# Patient Record
Sex: Female | Born: 1990 | Race: Black or African American | Hispanic: No | Marital: Single | Smoking: Never smoker
Health system: Southern US, Community
[De-identification: ages and names within clinical notes are randomized; demographics above are authoritative.]

## PROBLEM LIST (undated history)

## (undated) DIAGNOSIS — G43909 Migraine, unspecified, not intractable, without status migrainosus: Secondary | ICD-10-CM

## (undated) DIAGNOSIS — Z975 Presence of (intrauterine) contraceptive device: Secondary | ICD-10-CM

## (undated) HISTORY — PX: WISDOM TOOTH EXTRACTION: SHX21

## (undated) HISTORY — PX: OTHER SURGICAL HISTORY: SHX169

## (undated) HISTORY — DX: Presence of (intrauterine) contraceptive device: Z97.5

## (undated) HISTORY — DX: Migraine, unspecified, not intractable, without status migrainosus: G43.909

---
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
## 2012-08-03 ENCOUNTER — Encounter (HOSPITAL_COMMUNITY): Payer: Self-pay | Admitting: *Deleted

## 2012-08-03 ENCOUNTER — Emergency Department (INDEPENDENT_AMBULATORY_CARE_PROVIDER_SITE_OTHER)
Admission: EM | Admit: 2012-08-03 | Discharge: 2012-08-03 | Disposition: A | Discharge status: Home / Self Care | Payer: Medicaid Other | Source: Home / Self Care | Attending: Family Medicine | Admitting: Family Medicine

## 2012-08-03 DIAGNOSIS — N390 Urinary tract infection, site not specified: Secondary | ICD-10-CM

## 2012-08-03 LAB — POCT URINALYSIS DIP (DEVICE)
Nitrite: NEGATIVE
Protein, ur: 100 mg/dL — AB
pH: 6 (ref 5.0–8.0)

## 2012-08-03 MED ORDER — CEPHALEXIN 500 MG PO CAPS: 500.0000 mg | ORAL_CAPSULE | Freq: Four times a day (QID) | ORAL | Status: DC

## 2012-08-03 NOTE — ED Notes (Signed)
Py  Reports  Symptoms     Of  Back  Pain   As   Well  As   Frequency  Of  Urination    Symptoms  X  1  Week  She  Reports  She  Has  Been  utilising  Cranberry  Juice  To  Aid  i  The symptoms      She  Ambulated  With a  Steady fluid  Gait       She is  Sitting upright on  Exam table  In no acute  Distress

## 2012-08-03 NOTE — ED Provider Notes (Signed)
History     CSN: [id number]  Arrival date & time 08/03/12  1140   First MD Initiated Contact with Patient 08/03/12 1328      Chief Complaint  Patient presents with  . Urinary Tract Infection    (Consider location/radiation/quality/duration/timing/severity/associated sxs/prior treatment) Patient is a 21 y.o. female presenting with urinary tract infection. The history is provided by the patient.  Urinary Tract Infection This is a new problem. The current episode started more than 1 week ago. The problem has been gradually worsening. Pertinent negatives include no abdominal pain.    History reviewed. No pertinent past medical history.  History reviewed. No pertinent past surgical history.  No family history on file.  History  Substance Use Topics  . Smoking status: Never Smoker   . Smokeless tobacco: Not on file  . Alcohol Use: No    OB History    Grav Para Term Preterm Abortions TAB SAB Ect Mult Living                  Review of Systems  Constitutional: Negative.  Negative for fever and chills.  Gastrointestinal: Negative.  Negative for abdominal pain.  Genitourinary: Positive for dysuria, urgency and frequency. Negative for vaginal bleeding, vaginal discharge and vaginal pain.    Allergies  Review of patient's allergies indicates no known allergies.  Home Medications   Current Outpatient Rx  Name Route Sig Dispense Refill  . CEPHALEXIN 500 MG PO CAPS Oral Take 1 capsule (500 mg total) by mouth 4 (four) times daily. Take all of medicine and drink lots of fluids 20 capsule 0    BP 107/69  Pulse 62  Temp 98.7 F (37.1 C) (Oral)  Resp 20  SpO2 95%  Physical Exam  Nursing note and vitals reviewed. Constitutional: She is oriented to person, place, and time. She appears well-developed and well-nourished.  Neck: Normal range of motion. Neck supple.  Abdominal: Soft. Bowel sounds are normal. She exhibits no distension and no mass. There is no tenderness. There  is no rebound and no guarding.  Neurological: She is alert and oriented to person, place, and time.  Skin: Skin is warm and dry.    ED Course  Procedures (including critical care time)  Labs Reviewed  POCT URINALYSIS DIP (DEVICE) - Abnormal; Notable for the following:    Hgb urine dipstick LARGE (*)     Protein, ur 100 (*)     Leukocytes, UA MODERATE (*)  Biochemical Testing Only. Please order routine urinalysis from main lab if confirmatory testing is needed.   All other components within normal limits  POCT PREGNANCY, URINE   No results found.   1. UTI (lower urinary tract infection)       MDM  U/a abnl.        Linna Hoff, MD 08/03/12 [phone]

## 2013-11-02 ENCOUNTER — Encounter (HOSPITAL_COMMUNITY): Payer: Self-pay | Admitting: Emergency Medicine

## 2013-11-02 ENCOUNTER — Emergency Department (INDEPENDENT_AMBULATORY_CARE_PROVIDER_SITE_OTHER)
Admission: EM | Admit: 2013-11-02 | Discharge: 2013-11-02 | Disposition: A | Discharge status: Home / Self Care | Payer: Self-pay | Source: Home / Self Care | Attending: Family Medicine | Admitting: Family Medicine

## 2013-11-02 DIAGNOSIS — A088 Other specified intestinal infections: Secondary | ICD-10-CM

## 2013-11-02 DIAGNOSIS — A084 Viral intestinal infection, unspecified: Secondary | ICD-10-CM

## 2013-11-02 LAB — POCT URINALYSIS DIP (DEVICE)
Bilirubin Urine: NEGATIVE
GLUCOSE, UA: NEGATIVE mg/dL
KETONES UR: NEGATIVE mg/dL
Leukocytes, UA: NEGATIVE
Nitrite: NEGATIVE
Protein, ur: 30 mg/dL — AB
UROBILINOGEN UA: 0.2 mg/dL (ref 0.0–1.0)
pH: 5.5 (ref 5.0–8.0)

## 2013-11-02 LAB — POCT PREGNANCY, URINE: PREG TEST UR: NEGATIVE

## 2013-11-02 MED ORDER — ONDANSETRON 4 MG PO TBDP
8.0000 mg | ORAL_TABLET | Freq: Once | ORAL | Status: AC
  Administered 2013-11-02: 8 mg via ORAL

## 2013-11-02 MED ORDER — PROMETHAZINE HCL 25 MG PO TABS: 25.0000 mg | ORAL_TABLET | Freq: Four times a day (QID) | ORAL | Status: DC | PRN

## 2013-11-02 MED ORDER — ONDANSETRON 4 MG PO TBDP
ORAL_TABLET | ORAL | Status: AC
  Filled 2013-11-02: qty 2

## 2013-11-02 NOTE — ED Notes (Addendum)
Pt c/o vomiting and having diarrhea onset today... Has had 3 episodes of vomiting and 1 loose stool Denies: fevers, cold sxs, abd pain, urinary sxs ... LMP on 10/29/13 She is alert w/no signs of acute distress.

## 2013-11-02 NOTE — ED Provider Notes (Signed)
Kara Velez is a 23 y.o. female who presents to Urgent Care today for 12 hours of nausea vomiting and diarrhea. No fevers chills abdominal pain chest pain or trouble breathing. No medications tried. She is not giving much fluids down but is still urinating. She feels well otherwise. No other people with similar symptoms at home.   History reviewed. No pertinent past medical history. History  Substance Use Topics  . Smoking status: Never Smoker   . Smokeless tobacco: Not on file  . Alcohol Use: No   ROS as above Medications: Current Facility-Administered Medications  Medication Dose Route Frequency Provider Last Rate Last Dose  . ondansetron (ZOFRAN-ODT) disintegrating tablet 8 mg  8 mg Oral Once Rodolph BongEvan S Corey, MD       Current Outpatient Prescriptions  Medication Sig Dispense Refill  . promethazine (PHENERGAN) 25 MG tablet Take 1 tablet (25 mg total) by mouth every 6 (six) hours as needed for nausea or vomiting.  30 tablet  0    Exam:  BP 111/70  Pulse 96  Resp 18  SpO2 100%  LMP 10/29/2013 Gen: Well NAD HEENT: EOMI,  MMM Lungs: Normal work of breathing. CTABL Heart: RRR no MRG Abd: NABS, Soft. NT, ND Exts: Brisk capillary refill, warm and well perfused.   Results for orders placed during the hospital encounter of 11/02/13 (from the past 24 hour(s))  POCT URINALYSIS DIP (DEVICE)     Status: Abnormal   Collection Time    11/02/13  9:59 AM      Result Value Range   Glucose, UA NEGATIVE  NEGATIVE mg/dL   Bilirubin Urine NEGATIVE  NEGATIVE   Ketones, ur NEGATIVE  NEGATIVE mg/dL   Specific Gravity, Urine >=1.030  1.005 - 1.030   Hgb urine dipstick LARGE (*) NEGATIVE   pH 5.5  5.0 - 8.0   Protein, ur 30 (*) NEGATIVE mg/dL   Urobilinogen, UA 0.2  0.0 - 1.0 mg/dL   Nitrite NEGATIVE  NEGATIVE   Leukocytes, UA NEGATIVE  NEGATIVE  POCT PREGNANCY, URINE     Status: None   Collection Time    11/02/13  9:59 AM      Result Value Range   Preg Test, Ur NEGATIVE  NEGATIVE   No  results found.  Assessment and Plan: 23 y.o. female with viral gastroenteritis.  Plan to provide Zofran in the clinic in a Phenergan prescription. Encourage oral hydration. Followup with primary care provider.  Discussed warning signs or symptoms. Please see discharge instructions. Patient expresses understanding.    Rodolph BongEvan S Corey, MD 11/02/13 1012

## 2013-11-02 NOTE — Discharge Instructions (Signed)
Thank you for coming in today. Take Phenergan as needed for vomiting. Drink small amounts of water.  If you get dramatically worse to the emergency room.  If your belly pain worsens, or you have high fever, bad vomiting, blood in your stool or black tarry stool go to the Emergency Room.

## 2014-09-11 ENCOUNTER — Encounter (HOSPITAL_COMMUNITY): Payer: Self-pay | Admitting: *Deleted

## 2014-09-11 ENCOUNTER — Emergency Department (HOSPITAL_COMMUNITY)
Admission: EM | Admit: 2014-09-11 | Discharge: 2014-09-12 | Disposition: A | Discharge status: Home / Self Care | Payer: Medicaid Other | Attending: Emergency Medicine | Admitting: Emergency Medicine

## 2014-09-11 ENCOUNTER — Emergency Department (HOSPITAL_COMMUNITY): Payer: Medicaid Other

## 2014-09-11 DIAGNOSIS — R079 Chest pain, unspecified: Secondary | ICD-10-CM | POA: Diagnosis not present

## 2014-09-11 DIAGNOSIS — R42 Dizziness and giddiness: Secondary | ICD-10-CM | POA: Insufficient documentation

## 2014-09-11 DIAGNOSIS — Z3202 Encounter for pregnancy test, result negative: Secondary | ICD-10-CM | POA: Insufficient documentation

## 2014-09-11 LAB — BASIC METABOLIC PANEL
ANION GAP: 17 — AB (ref 5–15)
BUN: 9 mg/dL (ref 6–23)
CALCIUM: 9.5 mg/dL (ref 8.4–10.5)
CO2: 19 meq/L (ref 19–32)
Chloride: 104 mEq/L (ref 96–112)
Creatinine, Ser: 0.6 mg/dL (ref 0.50–1.10)
GFR calc Af Amer: >90 mL/min (ref >90)
GFR calc non Af Amer: >90 mL/min (ref >90)
Glucose, Bld: 85 mg/dL (ref 70–99)
Potassium: 3.8 mEq/L (ref 3.7–5.3)
SODIUM: 140 meq/L (ref 137–147)

## 2014-09-11 LAB — I-STAT TROPONIN, ED: TROPONIN I, POC: 0.02 ng/mL (ref 0.00–0.08)

## 2014-09-11 NOTE — ED Provider Notes (Signed)
CSN: [id number]     Arrival date & time 09/11/14  1940 History  This chart was scribed for [name], MD by Murriel HopperAlec Bankhead, ED Scribe. This patient was seen in room B18C/B18C and the patient's care was started at 12:25 AM.    Chief Complaint  Patient presents with  . Chest Pain    The history is provided by the patient. No language interpreter was used.     HPI Comments: Kara Velez is a 23 y.o. female who presents to the Emergency Department complaining of intermittent, sharp, stabbing right chest pain that has been present for 5-6 hours. Pt states that she has had chest pain before, but the severity of this episode is worse than all others she has experienced. Pt notes that she has a family history of blood clots, as her grandmother had one. Pt states that she is currently on birth control and denies any recent travel or periods where she was sedentary for a long amount of time. Pt denies fevers or chills. Denies cough or shortness of breath. She's had no lower extremity swelling or pain. She's had no fever or chills. Chest pain does not radiate.   History reviewed. No pertinent past medical history. History reviewed. No pertinent past surgical history. History reviewed. No pertinent family history. History  Substance Use Topics  . Smoking status: Never Smoker   . Smokeless tobacco: Not on file  . Alcohol Use: No   OB History    No data available     Review of Systems  Constitutional: Negative for fever and chills.  Respiratory: Negative for cough and shortness of breath.   Cardiovascular: Positive for chest pain. Negative for palpitations and leg swelling.  Gastrointestinal: Negative for nausea, vomiting, abdominal pain and diarrhea.  Musculoskeletal: Negative for myalgias, back pain, neck pain and neck stiffness.  Skin: Negative for rash and wound.  Neurological: Positive for dizziness and light-headedness. Negative for weakness, numbness and headaches.  All other  systems reviewed and are negative.     Allergies  Review of patient's allergies indicates no known allergies.  Home Medications   Prior to Admission medications   Medication Sig Start Date End Date Taking? Authorizing Provider  promethazine (PHENERGAN) 25 MG tablet Take 1 tablet (25 mg total) by mouth every 6 (six) hours as needed for nausea or vomiting. 11/02/13   Rodolph BongEvan S Corey, MD   BP 120/83 mmHg  Pulse 79  Temp(Src) 98.3 F (36.8 C)  Resp 18  Ht 5\' 1"  (1.549 m)  Wt 110 lb (49.896 kg)  BMI 20.80 kg/m2  SpO2 100% Physical Exam  Constitutional: She is oriented to person, place, and time. She appears well-developed and well-nourished. No distress.  HENT:  Head: Normocephalic and atraumatic.  Mouth/Throat: Oropharynx is clear and moist.  Eyes: EOM are normal. Pupils are equal, round, and reactive to light.  Neck: Normal range of motion. Neck supple.  Cardiovascular: Normal rate and regular rhythm.   Pulmonary/Chest: Effort normal and breath sounds normal. No respiratory distress. She has no wheezes. She has no rales. She exhibits no tenderness.  Abdominal: Soft. Bowel sounds are normal. She exhibits no distension and no mass. There is no tenderness. There is no rebound and no guarding.  Musculoskeletal: Normal range of motion. She exhibits no edema or tenderness.  No calf swelling or tenderness.  Neurological: She is alert and oriented to person, place, and time.  Please all extremities without deficit. Sensation is grossly intact.  Skin: Skin is  warm and dry. No rash noted. No erythema.  Psychiatric: She has a normal mood and affect. Her behavior is normal.  Nursing note and vitals reviewed.   ED Course  Procedures (including critical care time)  DIAGNOSTIC STUDIES: Oxygen Saturation is 100% on RA, normal by my interpretation.    COORDINATION OF CARE: 12:26 AM Discussed treatment plan with pt at bedside and pt agreed to plan.   Labs Review Labs Reviewed  BASIC  METABOLIC PANEL - Abnormal; Notable for the following:    Anion gap 17 (*)    All other components within normal limits  I-STAT TROPOININ, ED    Imaging Review No results found.   EKG Interpretation None      MDM   Final diagnoses:  Chest pain    I personally performed the services described in this documentation, which was scribed in my presence. The recorded information has been reviewed and is accurate.  Patient with atypical chest pain but does have risk factors for PE. D-dimer is mildly elevated. Will get CT to rule out PE. CT without acute findings. Patient's chest pain is improved. Low suspicion for coronary artery disease. Likely pleurisy versus chest wall pain. Return precautions given.  Loren Raceravid Dahl Higinbotham, MD 09/13/14 [phone]

## 2014-09-11 NOTE — ED Notes (Signed)
Pt in c/o intermittent chest pain today, states she also felt dizzy at work, no distress noted, history of similar pains in the past but has never had it checked out

## 2014-09-11 NOTE — ED Notes (Signed)
Received call from lab stating blood needed to be recollected.  Attempted to call pt to triage with no response

## 2014-09-12 ENCOUNTER — Encounter (HOSPITAL_COMMUNITY): Payer: Self-pay

## 2014-09-12 ENCOUNTER — Emergency Department (HOSPITAL_COMMUNITY): Payer: Medicaid Other

## 2014-09-12 LAB — PREGNANCY, URINE: PREG TEST UR: NEGATIVE

## 2014-09-12 LAB — D-DIMER, QUANTITATIVE: D-Dimer, Quant: 0.94 ug/mL-FEU — ABNORMAL HIGH (ref 0.00–0.48)

## 2014-09-12 MED ORDER — IOHEXOL 350 MG/ML SOLN
100.0000 mL | Freq: Once | INTRAVENOUS | Status: AC | PRN
  Administered 2014-09-12: 80 mL via INTRAVENOUS

## 2014-09-12 MED ORDER — IBUPROFEN 600 MG PO TABS: 600.0000 mg | ORAL_TABLET | Freq: Three times a day (TID) | ORAL | Status: DC | PRN

## 2014-09-12 MED ORDER — KETOROLAC TROMETHAMINE 30 MG/ML IJ SOLN
30.0000 mg | Freq: Once | INTRAMUSCULAR | Status: AC
  Administered 2014-09-12: 30 mg via INTRAVENOUS
  Filled 2014-09-12: qty 1

## 2014-09-12 NOTE — Discharge Instructions (Signed)

## 2015-02-05 ENCOUNTER — Encounter (HOSPITAL_COMMUNITY): Payer: Self-pay | Admitting: Emergency Medicine

## 2015-02-05 ENCOUNTER — Emergency Department (HOSPITAL_COMMUNITY)
Admission: EM | Admit: 2015-02-05 | Discharge: 2015-02-05 | Disposition: A | Discharge status: Home / Self Care | Payer: No Typology Code available for payment source | Attending: Emergency Medicine | Admitting: Emergency Medicine

## 2015-02-05 DIAGNOSIS — Y92410 Unspecified street and highway as the place of occurrence of the external cause: Secondary | ICD-10-CM | POA: Insufficient documentation

## 2015-02-05 DIAGNOSIS — S3992XA Unspecified injury of lower back, initial encounter: Secondary | ICD-10-CM | POA: Diagnosis not present

## 2015-02-05 DIAGNOSIS — S4992XA Unspecified injury of left shoulder and upper arm, initial encounter: Secondary | ICD-10-CM | POA: Diagnosis not present

## 2015-02-05 DIAGNOSIS — S199XXA Unspecified injury of neck, initial encounter: Secondary | ICD-10-CM | POA: Diagnosis present

## 2015-02-05 DIAGNOSIS — Y998 Other external cause status: Secondary | ICD-10-CM | POA: Diagnosis not present

## 2015-02-05 DIAGNOSIS — M62838 Other muscle spasm: Secondary | ICD-10-CM

## 2015-02-05 DIAGNOSIS — Y9389 Activity, other specified: Secondary | ICD-10-CM | POA: Diagnosis not present

## 2015-02-05 MED ORDER — METHOCARBAMOL 500 MG PO TABS: 500.0000 mg | ORAL_TABLET | Freq: Two times a day (BID) | ORAL | Status: DC

## 2015-02-05 MED ORDER — NAPROXEN 500 MG PO TABS
500.0000 mg | ORAL_TABLET | Freq: Once | ORAL | Status: AC
  Administered 2015-02-05: 500 mg via ORAL
  Filled 2015-02-05: qty 1

## 2015-02-05 MED ORDER — NAPROXEN 500 MG PO TABS: 500.0000 mg | ORAL_TABLET | Freq: Two times a day (BID) | ORAL | Status: DC
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
## 2015-09-22 ENCOUNTER — Emergency Department (HOSPITAL_COMMUNITY): Payer: Medicaid Other

## 2015-09-22 ENCOUNTER — Encounter (HOSPITAL_COMMUNITY): Payer: Self-pay | Admitting: *Deleted

## 2015-09-22 ENCOUNTER — Emergency Department (HOSPITAL_COMMUNITY)
Admission: EM | Admit: 2015-09-22 | Discharge: 2015-09-22 | Disposition: A | Discharge status: Home / Self Care | Payer: Medicaid Other | Attending: Emergency Medicine | Admitting: Emergency Medicine

## 2015-09-22 DIAGNOSIS — Z79899 Other long term (current) drug therapy: Secondary | ICD-10-CM | POA: Insufficient documentation

## 2015-09-22 DIAGNOSIS — R079 Chest pain, unspecified: Secondary | ICD-10-CM

## 2015-09-22 DIAGNOSIS — M545 Low back pain: Secondary | ICD-10-CM | POA: Insufficient documentation

## 2015-09-22 LAB — URINALYSIS, ROUTINE W REFLEX MICROSCOPIC
Bilirubin Urine: NEGATIVE
Glucose, UA: NEGATIVE mg/dL
HGB URINE DIPSTICK: NEGATIVE
KETONES UR: NEGATIVE mg/dL
Leukocytes, UA: NEGATIVE
Nitrite: NEGATIVE
PH: 5.5 (ref 5.0–8.0)
PROTEIN: NEGATIVE mg/dL
Specific Gravity, Urine: 1.01 (ref 1.005–1.030)

## 2015-09-22 LAB — COMPREHENSIVE METABOLIC PANEL
ALK PHOS: 74 U/L (ref 38–126)
ALT: 13 U/L — ABNORMAL LOW (ref 14–54)
AST: 20 U/L (ref 15–41)
Albumin: 4 g/dL (ref 3.5–5.0)
Anion gap: 8 (ref 5–15)
BUN: 6 mg/dL (ref 6–20)
CALCIUM: 9.2 mg/dL (ref 8.9–10.3)
CO2: 22 mmol/L (ref 22–32)
CREATININE: 0.7 mg/dL (ref 0.44–1.00)
Chloride: 108 mmol/L (ref 101–111)
GFR calc non Af Amer: >60 mL/min (ref >60)
Glucose, Bld: 109 mg/dL — ABNORMAL HIGH (ref 65–99)
Potassium: 3.8 mmol/L (ref 3.5–5.1)
SODIUM: 138 mmol/L (ref 135–145)
TOTAL PROTEIN: 6.8 g/dL (ref 6.5–8.1)
Total Bilirubin: 1 mg/dL (ref 0.3–1.2)

## 2015-09-22 LAB — D-DIMER, QUANTITATIVE: D-Dimer, Quant: <0.27 ug/mL-FEU (ref 0.00–0.50)

## 2015-09-22 LAB — CBC
HCT: 41.2 % (ref 36.0–46.0)
HEMOGLOBIN: 13.4 g/dL (ref 12.0–15.0)
MCH: 26.1 pg (ref 26.0–34.0)
MCHC: 32.5 g/dL (ref 30.0–36.0)
MCV: 80.3 fL (ref 78.0–100.0)
Platelets: 223 10*3/uL (ref 150–400)
RBC: 5.13 MIL/uL — AB (ref 3.87–5.11)
RDW: 12.5 % (ref 11.5–15.5)
WBC: 13.4 10*3/uL — ABNORMAL HIGH (ref 4.0–10.5)

## 2015-09-22 LAB — PROTIME-INR
INR: 1.23 (ref 0.00–1.49)
PROTHROMBIN TIME: 15.7 s — AB (ref 11.6–15.2)

## 2015-09-22 LAB — LIPASE, BLOOD: Lipase: 37 U/L (ref 11–51)

## 2015-09-22 LAB — TROPONIN I

## 2015-09-22 LAB — APTT: aPTT: 24 seconds (ref 24–37)

## 2015-09-22 MED ORDER — SODIUM CHLORIDE 0.9 % IV SOLN
Freq: Once | INTRAVENOUS | Status: AC
  Administered 2015-09-22: 11:00:00 via INTRAVENOUS

## 2015-09-22 MED ORDER — SODIUM CHLORIDE 0.9 % IV SOLN: 20.0000 mL | INTRAVENOUS | Status: DC

## 2015-09-22 MED ORDER — ASPIRIN 81 MG PO CHEW
324.0000 mg | CHEWABLE_TABLET | Freq: Once | ORAL | Status: AC
  Administered 2015-09-22: 324 mg via ORAL
  Filled 2015-09-22: qty 4

## 2015-09-22 MED ORDER — NITROGLYCERIN 0.4 MG SL SUBL: 0.4000 mg | SUBLINGUAL_TABLET | SUBLINGUAL | Status: DC | PRN

## 2015-09-22 NOTE — ED Notes (Signed)
Pt reports she was awake when CP started this AM.Pt was in bed unable to go to sleep.

## 2015-09-22 NOTE — ED Provider Notes (Signed)
CSN: [id number]     Arrival date & time 09/22/15  [id number] History   First MD Initiated Contact with Patient 09/22/15 0945     Chief Complaint  Patient presents with  . Chest Pain  . Tailbone Pain     (Consider location/radiation/quality/duration/timing/severity/associated sxs/prior Treatment) HPI  24 year old female who comes in today complaining of some sharp chest pain that began this morning. She states that she had been asleep but had been up for an hour attempting to have a bowel movement. When she got back in bed she began having some sharp anterior chest pain. Has been continuous in nature. Denies any cough, or fever. She has had some chills. She feels mildly dyspneic. She denies any other similar symptoms in the past. She has no history of blood clot in her legs or her lungs. No headache, neck pain, abdominal pain, nausea, vomiting, diarrhea, no abnormal vaginal discharge, and no reported pregnancy.  She has a Nexplanon implant.  History reviewed. No pertinent past medical history. Past Surgical History  Procedure Laterality Date  . Extraction of wisdom teeth    . Wisdom tooth extraction      three removed   Family History  Problem Relation Age of Onset  . Hypertension Other   . Cancer Other   . Diabetes Other    Social History  Substance Use Topics  . Smoking status: Never Smoker   . Smokeless tobacco: None  . Alcohol Use: Yes     Comment: occ    OB History    No data available     Review of Systems  All other systems reviewed and are negative.     Allergies  Review of patient's allergies indicates no known allergies.  Home Medications   Prior to Admission medications   Medication Sig Start Date End Date Taking? Authorizing Provider  etonogestrel (NEXPLANON) 68 MG IMPL implant 1 each by Subdermal route once.   Yes Historical Provider, MD  ibuprofen (ADVIL,MOTRIN) 600 MG tablet Take 1 tablet (600 mg total) by mouth every 8 (eight) hours as needed for moderate  pain. 09/12/14  Yes Loren Raceravid Yelverton, MD  methocarbamol (ROBAXIN) 500 MG tablet Take 1 tablet (500 mg total) by mouth 2 (two) times daily. Patient not taking: Reported on 09/22/2015 02/05/15   Antony MaduraKelly Humes, PA-C  naproxen (NAPROSYN) 500 MG tablet Take 1 tablet (500 mg total) by mouth 2 (two) times daily. Patient not taking: Reported on 09/22/2015 02/05/15   Antony MaduraKelly Humes, PA-C  promethazine (PHENERGAN) 25 MG tablet Take 1 tablet (25 mg total) by mouth every 6 (six) hours as needed for nausea or vomiting. Patient not taking: Reported on 02/05/2015 11/02/13   Rodolph BongEvan S Corey, MD   BP 119/73 mmHg  Pulse 99  Temp(Src) 99.1 F (37.3 C) (Oral)  Resp 13  SpO2 100%  LMP  Physical Exam  Constitutional: She is oriented to person, place, and time. She appears well-developed and well-nourished.  HENT:  Head: Normocephalic and atraumatic.  Right Ear: External ear normal.  Left Ear: External ear normal.  Nose: Nose normal.  Mouth/Throat: Oropharynx is clear and moist.  Eyes: Conjunctivae and EOM are normal. Pupils are equal, round, and reactive to light.  Neck: Normal range of motion. Neck supple.  Cardiovascular: Normal rate, regular rhythm, normal heart sounds and intact distal pulses.   Pulmonary/Chest: Effort normal and breath sounds normal.  Abdominal: Soft. Bowel sounds are normal.  Musculoskeletal: Normal range of motion.  Neurological: She is alert and oriented to  person, place, and time. She has normal reflexes.  Skin: Skin is warm and dry.  Psychiatric: She has a normal mood and affect. Her behavior is normal. Judgment and thought content normal.  Nursing note and vitals reviewed.   ED Course  Procedures (including critical care time) Labs Review Labs Reviewed  CBC - Abnormal; Notable for the following:    WBC 13.4 (*)    RBC 5.13 (*)    All other components within normal limits  COMPREHENSIVE METABOLIC PANEL - Abnormal; Notable for the following:    Glucose, Bld 109 (*)    ALT 13 (*)     All other components within normal limits  PROTIME-INR - Abnormal; Notable for the following:    Prothrombin Time 15.7 (*)    All other components within normal limits  URINALYSIS, ROUTINE W REFLEX MICROSCOPIC (NOT AT [facility]) - Abnormal; Notable for the following:    APPearance CLOUDY (*)    All other components within normal limits  APTT  TROPONIN I  D-DIMER, QUANTITATIVE (NOT AT [facility])  LIPASE, BLOOD    Imaging Review Dg Chest 2 View  09/22/2015  CLINICAL DATA:  Chest pain and nausea EXAM: CHEST  2 VIEW COMPARISON:  Chest radiograph September 11, 2014; chest CT September 12, 2014 FINDINGS: The lungs are clear. The heart size and pulmonary vascularity are normal. No adenopathy. No pneumothorax. No bone lesions. IMPRESSION: No edema or consolidation. Electronically Signed   By: Bretta Bang III M.D.   On: 09/22/2015 10:56   I have personally reviewed and evaluated these images and lab results as part of my medical decision-making.   EKG Interpretation   Date/Time:  Saturday September 22 2015 09:46:01 EST Ventricular Rate:  106 PR Interval:  136 QRS Duration: 74 QT Interval:  324 QTC Calculation: 430 R Axis:   80 Text Interpretation:  Sinus tachycardia Confirmed by Brexley Cutshaw MD, Duwayne Heck  ([id number]) on 09/22/2015 10:01:16 AM      MDM   Final diagnoses:  Chest pain, unspecified chest pain type   24 year old female presents today with sharp anterior chest pain with some tenderness palpation epigastrium. She has not had nausea or vomiting. Workup reveals no evidence of myocardial ischemia with a normal d-dimer and normal exam, very low index of suspicion for pulmonary embolism. X-Damaree Sargent is clear. Patient advised regarding return precautions and need for follow-up of voices understanding.   Margarita Grizzle, MD 09/23/15 [id number]

## 2015-09-22 NOTE — Discharge Instructions (Signed)

## 2015-09-22 NOTE — ED Notes (Signed)
Patient returned from X-ray 

## 2015-09-22 NOTE — ED Notes (Signed)
Patient transported to X-ray 

## 2015-12-22 IMAGING — CT CT ANGIO CHEST
1 of 7 series · 18 of 36 positions shown · IV contrast (Iohexol (Omnipaque 350))
Comparison: None.

CLINICAL DATA: 22-year-old female with right chest pain. Initial
encounter.

EXAM:
CT ANGIOGRAPHY CHEST WITH CONTRAST
TECHNIQUE: Multidetector CT imaging of the chest was performed using the
standard protocol during bolus administration of intravenous
contrast. Multiplanar CT image reconstructions and MIPs were
obtained to evaluate the vascular anatomy.
CONTRAST:  80mL OMNIPAQUE IOHEXOL 350 MG/ML SOLN

[Series 407: thins pacs · axial · 0.66mm/px · z∈[+26,+289]mm · 18 of 295 slices shown]
[im 16/295  lung]
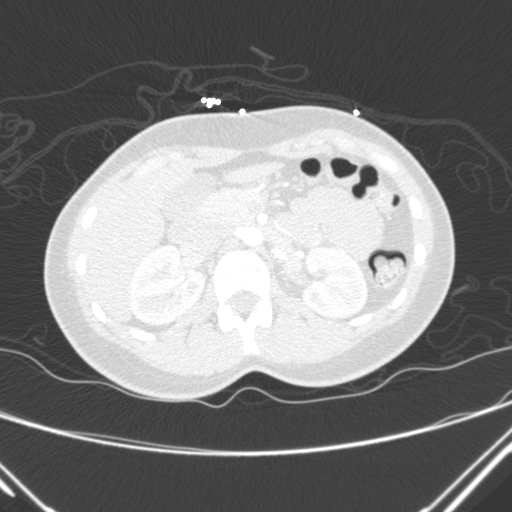
[im 31/295  mediastinal]
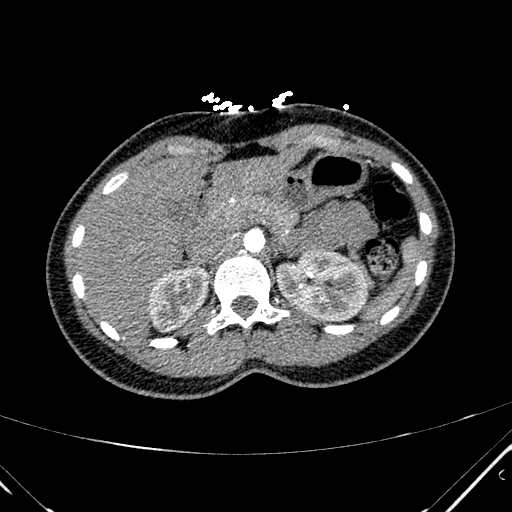
[im 47/295  lung]
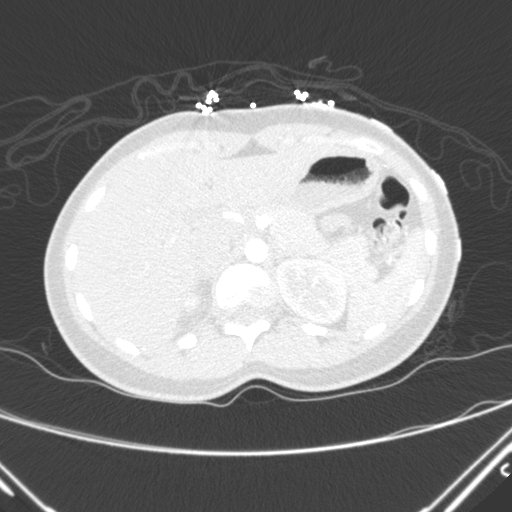
[im 62/295  mediastinal]
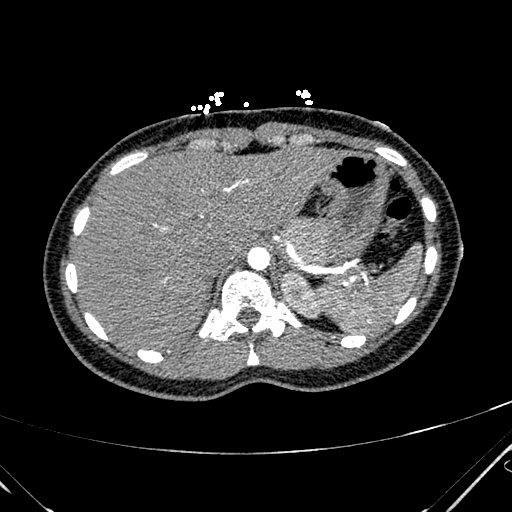
[im 78/295  lung]
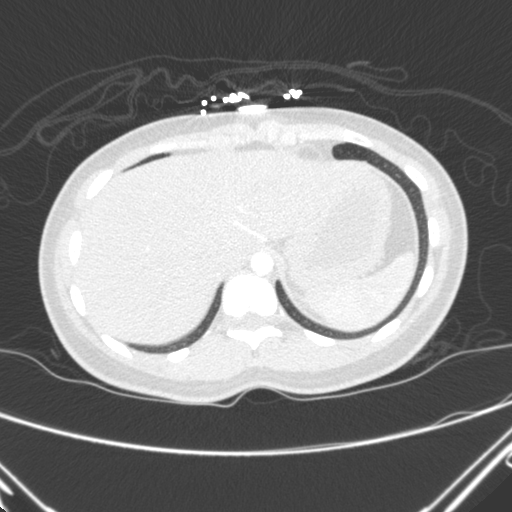
[im 93/295  mediastinal]
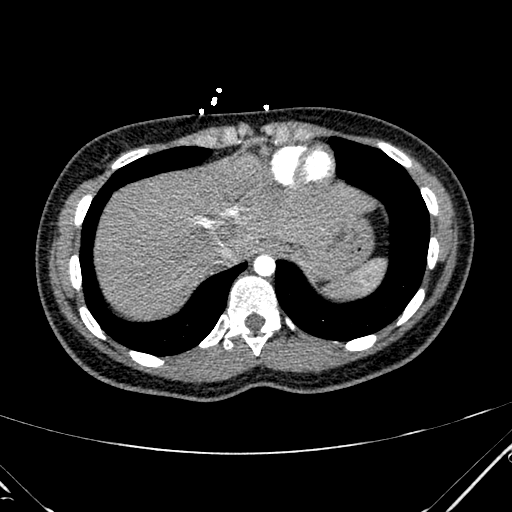
[im 109/295  lung]
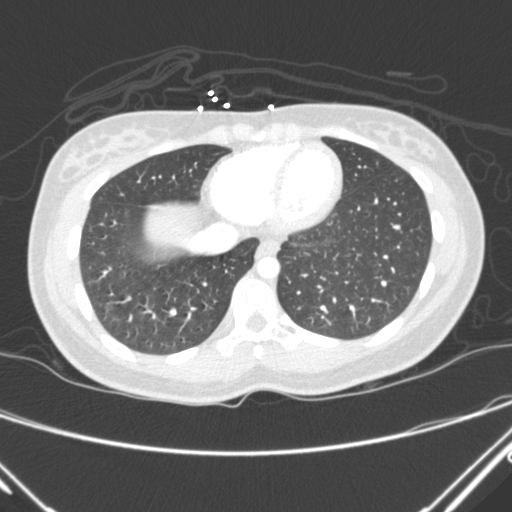
[im 124/295  mediastinal]
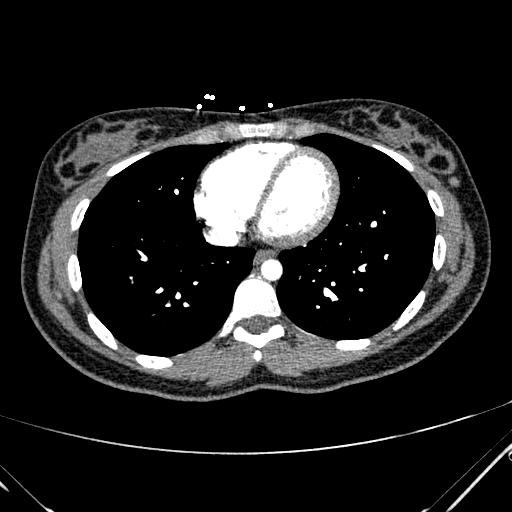
[im 140/295  lung]
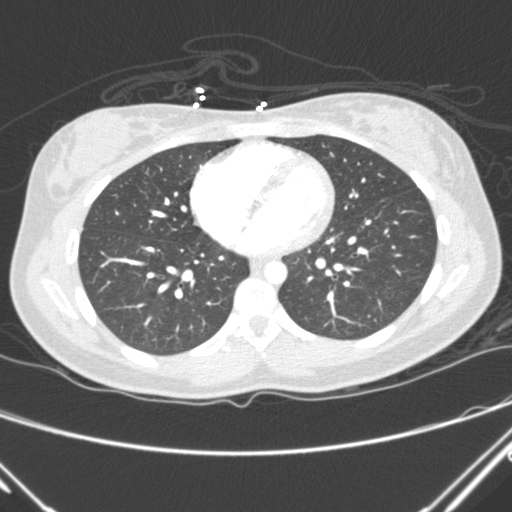
[im 155/295  mediastinal]
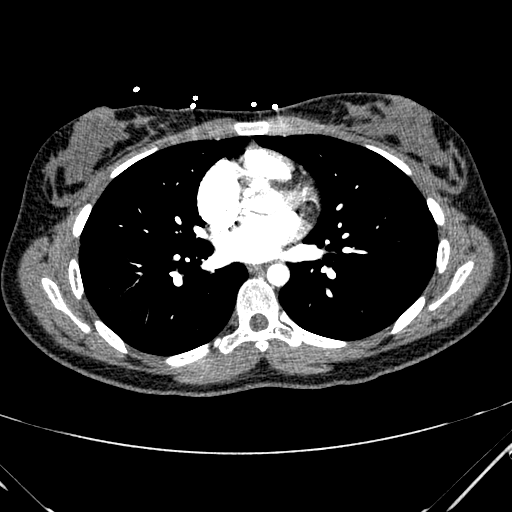
[im 171/295  lung]
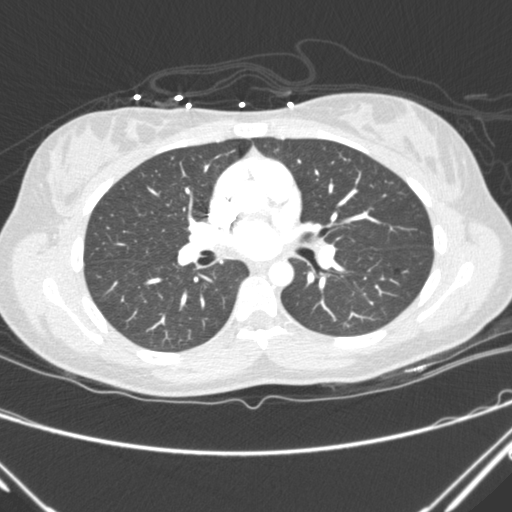
[im 186/295  mediastinal]
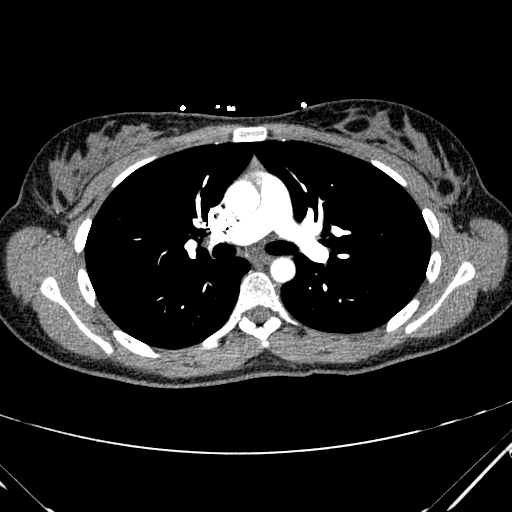
[im 202/295  lung]
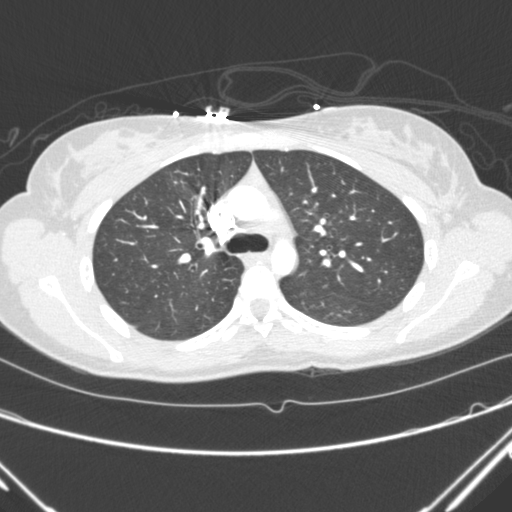
[im 217/295  mediastinal]
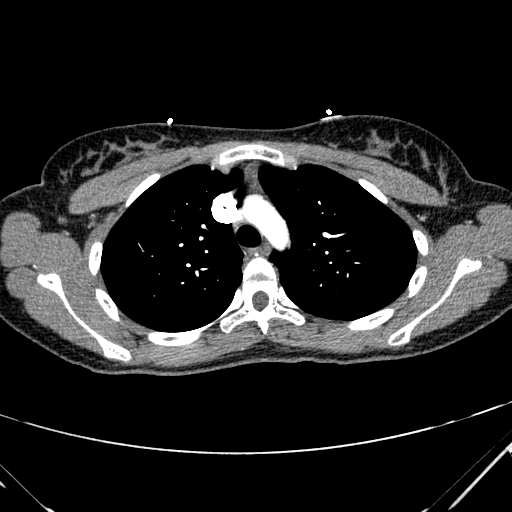
[im 233/295  lung]
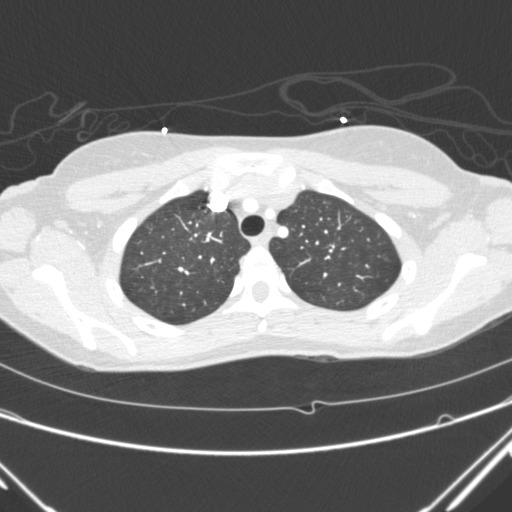
[im 248/295  mediastinal]
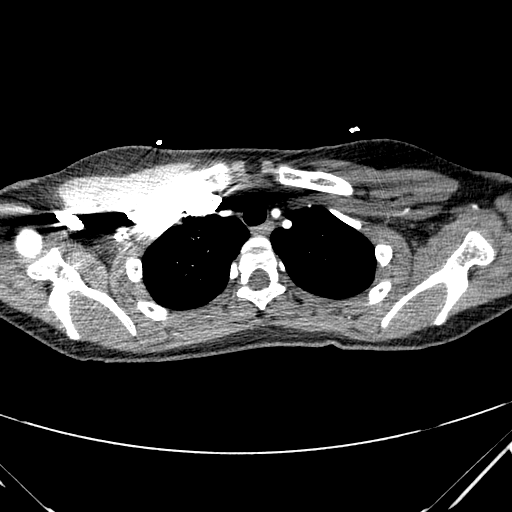
[im 264/295  lung]
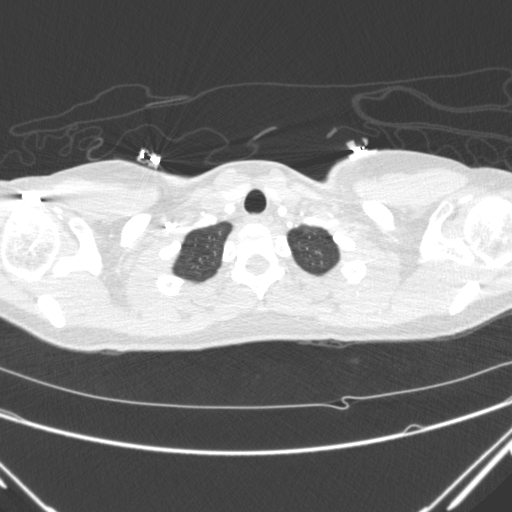
[im 279/295  mediastinal]
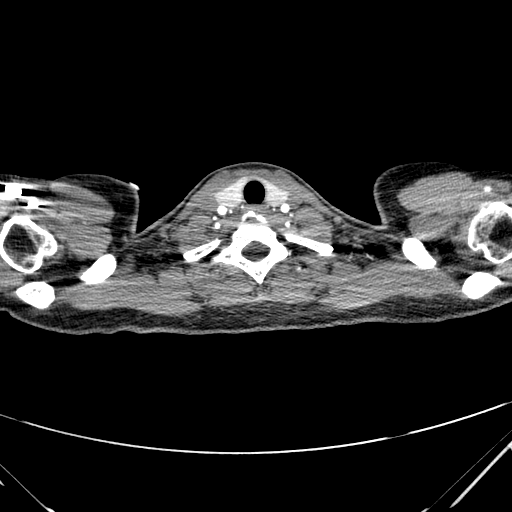

[18 of 36 positions shown; findings below may reference images not displayed]

FINDINGS: This is a technically satisfactory study.

No pulmonary emboli are identified.

There is no evidence of aortic aneurysm.

The heart and great vessels are unremarkable.

There is no evidence of pleural/pericardial effusion or enlarged
lymph nodes.

The lungs are clear.

There is no evidence of airspace disease, consolidation, nodule,
mass or endobronchial/ endotracheal lesion.

The visualized upper abdomen is unremarkable.

No acute or suspicious bony abnormalities are identified.

Review of the MIP images confirms the above findings.
IMPRESSION: Unremarkable CTA chest.  No evidence of pulmonary emboli.

## 2016-04-11 ENCOUNTER — Ambulatory Visit (INDEPENDENT_AMBULATORY_CARE_PROVIDER_SITE_OTHER): Payer: Medicaid Other | Admitting: Certified Nurse Midwife

## 2016-04-11 ENCOUNTER — Encounter: Payer: Self-pay | Admitting: Certified Nurse Midwife

## 2016-04-11 VITALS — BP 119/82 | HR 71 | Wt 121.0 lb

## 2016-04-11 DIAGNOSIS — Z975 Presence of (intrauterine) contraceptive device: Secondary | ICD-10-CM | POA: Insufficient documentation

## 2016-04-11 DIAGNOSIS — Z3049 Encounter for surveillance of other contraceptives: Secondary | ICD-10-CM | POA: Diagnosis not present

## 2016-04-11 DIAGNOSIS — R51 Headache: Secondary | ICD-10-CM

## 2016-04-11 DIAGNOSIS — Z01419 Encounter for gynecological examination (general) (routine) without abnormal findings: Secondary | ICD-10-CM

## 2016-04-11 DIAGNOSIS — R519 Headache, unspecified: Secondary | ICD-10-CM

## 2016-04-11 DIAGNOSIS — G43011 Migraine without aura, intractable, with status migrainosus: Secondary | ICD-10-CM

## 2016-04-11 MED ORDER — IBUPROFEN 800 MG PO TABS: 800.0000 mg | ORAL_TABLET | Freq: Three times a day (TID) | ORAL | Status: DC | PRN

## 2016-04-11 MED ORDER — VITAFOL GUMMIES 3.33-0.333-34.8 MG PO CHEW: 3.0000 | CHEWABLE_TABLET | Freq: Every day | ORAL | Status: DC

## 2016-04-11 MED ORDER — BUTALBITAL-APAP-CAFFEINE 50-325-40 MG PO TABS: 1.0000 | ORAL_TABLET | Freq: Four times a day (QID) | ORAL | Status: DC | PRN

## 2016-04-11 NOTE — Progress Notes (Signed)
Patient ID: Kara Velez, female   DOB: 06-17-1991, 25 y.o.   MRN: [id number]    Subjective:        Kara Velez is a 25 y.o. female here for a routine exam.  Current complaints: none.  Nexplanon inserted 12/20/2014, amenorrhea.  Hx of migraine HA.  Has not tried anything for it besides flexeril.  Is not exercising regularly, encouragement given.    Currently sexually active same long term partner.  Declines blood testing for STDs.    Personal health questionnaire:  Is patient Ashkenazi Jewish, have a family history of breast and/or ovarian cancer: no Is there a family history of uterine cancer diagnosed at age < 1850, gastrointestinal cancer, urinary tract cancer, family member who is a Personnel officerLynch syndrome-associated carrier: no Is the patient overweight and hypertensive, family history of diabetes, personal history of gestational diabetes, preeclampsia or PCOS: no Is patient over 5755, have PCOS,  family history of premature CHD under age 265, diabetes, smoke, have hypertension or peripheral artery disease:  yes At any time, has a partner hit, kicked or otherwise hurt or frightened you?: no Over the past 2 weeks, have you felt down, depressed or hopeless?: no Over the past 2 weeks, have you felt little interest or pleasure in doing things?:no   Gynecologic History No LMP recorded. [name]. Contraception: Nexplanon Last Pap: at [facility]. Results were: normal according to the patient Last mammogram: N/A.   Obstetric History OB History  No data available    No past medical history on file.  Past Surgical History  Procedure Laterality Date  . Extraction of wisdom teeth    . Wisdom tooth extraction      three removed     Current outpatient prescriptions:  .  etonogestrel (NEXPLANON) 68 MG IMPL implant, 1 each by Subdermal route once., Disp: , Rfl:  .  methocarbamol (ROBAXIN) 500 MG tablet, Take 1 tablet (500 mg total) by mouth 2 (two) times daily., Disp: 20 tablet, Rfl:  0 .  naproxen (NAPROSYN) 500 MG tablet, Take 1 tablet (500 mg total) by mouth 2 (two) times daily., Disp: 30 tablet, Rfl: 0 .  butalbital-acetaminophen-caffeine (FIORICET) 50-325-40 MG tablet, Take 1-2 tablets by mouth every 6 (six) hours as needed for headache., Disp: 45 tablet, Rfl: 4 .  ibuprofen (ADVIL,MOTRIN) 800 MG tablet, Take 1 tablet (800 mg total) by mouth every 8 (eight) hours as needed., Disp: 60 tablet, Rfl: 1 .  Prenatal Vit-Fe Phos-FA-Omega (VITAFOL GUMMIES) 3.33-0.333-34.8 MG CHEW, Chew 3 tablets by mouth daily., Disp: 90 tablet, Rfl: 12 No Known Allergies  Social History  Substance Use Topics  . Smoking status: Never Smoker   . Smokeless tobacco: Not on file  . Alcohol Use: Yes     Comment: occ     Family History  Problem Relation Age of Onset  . Hypertension Other   . Cancer Other   . Diabetes Other       Review of Systems  Constitutional: negative for fatigue and weight loss Respiratory: negative for cough and wheezing Cardiovascular: negative for chest pain, fatigue and palpitations Gastrointestinal: negative for abdominal pain and change in bowel habits Musculoskeletal:negative for myalgias Neurological: negative for gait problems and tremors, +HA  Behavioral/Psych: negative for abusive relationship, depression Endocrine: negative for temperature intolerance   Genitourinary:negative for abnormal menstrual periods, genital lesions, hot flashes, sexual problems and vaginal discharge Integument/breast: negative for breast lump, breast tenderness, nipple discharge and skin lesion(s)    Objective:  BP 119/82 mmHg  Pulse 71  Wt 121 lb (54.885 kg) General:   alert  Skin:   no rash or abnormalities  Lungs:   clear to auscultation bilaterally  Heart:   regular rate and rhythm, S1, S2 normal, no murmur, click, rub or gallop  Breasts:   normal without suspicious masses, skin or nipple changes or axillary nodes  Abdomen:  normal findings: no organomegaly,  soft, non-tender and no hernia  Pelvis:  External genitalia: normal general appearance Urinary system: urethral meatus normal and bladder without fullness, nontender Vaginal: normal without tenderness, induration or masses Cervix: normal appearance Adnexa: normal bimanual exam Uterus: anteverted and non-tender, normal size   Lab Review Urine pregnancy test Labs reviewed yes Radiologic studies reviewed no  50% of 30 min visit spent on counseling and coordination of care.   Assessment:    Healthy female exam.   HA  Nexplanon for contraception  Plan:    Education reviewed: calcium supplements, depression evaluation, low fat, low cholesterol diet, safe sex/STD prevention, self breast exams, skin cancer screening and weight bearing exercise. Contraception: Nexplanon. Follow up in: 1 year.   Meds ordered this encounter  Medications  . Prenatal Vit-Fe Phos-FA-Omega (VITAFOL GUMMIES) 3.33-0.333-34.8 MG CHEW    Sig: Chew 3 tablets by mouth daily.    Dispense:  90 tablet    Refill:  12  . ibuprofen (ADVIL,MOTRIN) 800 MG tablet    Sig: Take 1 tablet (800 mg total) by mouth every 8 (eight) hours as needed.    Dispense:  60 tablet    Refill:  1  . butalbital-acetaminophen-caffeine (FIORICET) 50-325-40 MG tablet    Sig: Take 1-2 tablets by mouth every 6 (six) hours as needed for headache.    Dispense:  45 tablet    Refill:  4   No orders of the defined types were placed in this encounter.   Need to obtain previous records Possible management options include: Neurology consult Follow up as needed.

## 2016-04-15 LAB — NUSWAB VG, CANDIDA 6SP
ATOPOBIUM VAGINAE: HIGH {score} — AB
BVAB 2: HIGH Score — AB
CANDIDA ALBICANS, NAA: NEGATIVE
Candida glabrata, NAA: NEGATIVE
Candida krusei, NAA: NEGATIVE
Candida lusitaniae, NAA: NEGATIVE
Candida parapsilosis, NAA: NEGATIVE
Candida tropicalis, NAA: NEGATIVE
MEGASPHAERA 1: HIGH {score} — AB
TRICH VAG BY NAA: NEGATIVE

## 2016-04-16 ENCOUNTER — Other Ambulatory Visit (HOSPITAL_COMMUNITY): Payer: Self-pay | Admitting: Certified Nurse Midwife

## 2016-04-16 DIAGNOSIS — B9689 Other specified bacterial agents as the cause of diseases classified elsewhere: Secondary | ICD-10-CM

## 2016-04-16 DIAGNOSIS — N760 Acute vaginitis: Principal | ICD-10-CM

## 2016-04-16 LAB — PAP IG W/ RFLX HPV ASCU: PAP Smear Comment: 0

## 2016-04-16 MED ORDER — METRONIDAZOLE 500 MG PO TABS: 500.0000 mg | ORAL_TABLET | Freq: Two times a day (BID) | ORAL | Status: DC

## 2016-04-26 ENCOUNTER — Encounter (HOSPITAL_COMMUNITY): Payer: Self-pay | Admitting: Emergency Medicine

## 2016-04-26 ENCOUNTER — Ambulatory Visit (HOSPITAL_COMMUNITY)
Admission: EM | Admit: 2016-04-26 | Discharge: 2016-04-26 | Disposition: A | Discharge status: Home / Self Care | Payer: No Typology Code available for payment source | Attending: Emergency Medicine | Admitting: Emergency Medicine

## 2016-04-26 DIAGNOSIS — T7840XA Allergy, unspecified, initial encounter: Secondary | ICD-10-CM

## 2016-04-26 NOTE — ED Provider Notes (Signed)
CSN: [id number]     Arrival date & time 04/26/16  1621 History   First MD Initiated Contact with Patient 04/26/16 1800     Chief Complaint  Patient presents with  . Pruritis   (Consider location/radiation/quality/duration/timing/severity/associated sxs/prior Treatment) HPI  She is a 25 year old woman here for evaluation of itching. She states she got dressed like usual this morning, and on the way to the bus stop developed intense itching and burning the bilateral feet. This persisted after she got on the bus. The itching stopped in the lower to mid shin area. She denies any new detergents, lotions, soaps, or other products. She does state it has been at least 3-4 months and she has worn these shoes.  She took a Benadryl, which almost completely relieved the itching. She denies any rashes. No difficulty breathing or wheezing. No sensation of throat closing or swelling.  History reviewed. No pertinent past medical history. Past Surgical History  Procedure Laterality Date  . Extraction of wisdom teeth    . Wisdom tooth extraction      three removed   Family History  Problem Relation Age of Onset  . Hypertension Other   . Cancer Other   . Diabetes Other    Social History  Substance Use Topics  . Smoking status: Never Smoker   . Smokeless tobacco: None  . Alcohol Use: Yes     Comment: occ    OB History    No data available     Review of Systems As in history of present illness Allergies  Review of patient's allergies indicates no known allergies.  Home Medications   Prior to Admission medications   Medication Sig Start Date End Date Taking? Authorizing Provider  butalbital-acetaminophen-caffeine (FIORICET) 50-325-40 MG tablet Take 1-2 tablets by mouth every 6 (six) hours as needed for headache. 04/11/16   Roe Coombs, CNM  etonogestrel (NEXPLANON) 68 MG IMPL implant 1 each by Subdermal route once.    Historical Provider, MD  ibuprofen (ADVIL,MOTRIN) 800 MG tablet Take 1  tablet (800 mg total) by mouth every 8 (eight) hours as needed. 04/11/16   Rachelle A Denney, CNM  methocarbamol (ROBAXIN) 500 MG tablet Take 1 tablet (500 mg total) by mouth 2 (two) times daily. 02/05/15   Antony Madura, PA-C  metroNIDAZOLE (FLAGYL) 500 MG tablet Take 1 tablet (500 mg total) by mouth 2 (two) times daily. 04/16/16   Rachelle A Denney, CNM  naproxen (NAPROSYN) 500 MG tablet Take 1 tablet (500 mg total) by mouth 2 (two) times daily. 02/05/15   Antony Madura, PA-C  Prenatal Vit-Fe Phos-FA-Omega (VITAFOL GUMMIES) 3.33-0.333-34.8 MG CHEW Chew 3 tablets by mouth daily. 04/11/16   Roe Coombs, CNM   Meds Ordered and Administered this Visit  Medications - No data to display  BP 118/76 mmHg  Pulse 73  Temp(Src) 98 F (36.7 C) (Oral)  Resp 16  SpO2 100% No data found.   Physical Exam  Constitutional: She is oriented to person, place, and time. She appears well-developed and well-nourished. No distress.  Cardiovascular: Normal rate.   Pulmonary/Chest: Effort normal.  Neurological: She is alert and oriented to person, place, and time.  Skin: No rash noted.  No rash on the feet or lower legs.    ED Course  Procedures (including critical care time)  Labs Review Labs Reviewed - No data to display  Imaging Review No results found.   MDM   1. Allergic reaction, initial encounter    This is  likely a reaction to something in her shoes given the localized nature. No urticaria or signs of fungal infection. Continue treatment with Benadryl as needed. Can also use OTC nondrowsy allergy medicine as needed. Recommended either washing the shoes or throwing them away. Follow-up as needed.  I have verbally reviewed the discharge instructions with the patient. A printed AVS was given to the patient.  All questions were answered prior to discharge.     Charm RingsErin J Honig, MD 04/26/16 [phone]

## 2016-04-26 NOTE — Discharge Instructions (Signed)
It was likely something in your shoes that caused the itching. Benadryl is the best medicine to take for this. You can take 1-2 tablets every 4-6 hours as needed. This medicine will likely make you sleepy. Claritin or Zyrtec is a good non-drowsy option for daytime use. I would recommend either throwing away the shoes or trying to wash them. This should clear up in the next 24-48 hours. Follow-up as needed.

## 2016-04-26 NOTE — ED Notes (Signed)
D/c by Dr. Piedad ClimesHonig

## 2016-04-26 NOTE — ED Notes (Addendum)
Pt c/o itching and burning sensation onset 1545 today on bilateral feet... No visible rash... Voices no other concerns... A&O x4... NAD

## 2016-11-24 ENCOUNTER — Ambulatory Visit: Payer: Medicaid Other | Admitting: Family Medicine

## 2016-12-11 ENCOUNTER — Ambulatory Visit (INDEPENDENT_AMBULATORY_CARE_PROVIDER_SITE_OTHER): Payer: Self-pay | Admitting: Family Medicine

## 2016-12-11 ENCOUNTER — Encounter: Payer: Self-pay | Admitting: Family Medicine

## 2016-12-11 ENCOUNTER — Ambulatory Visit: Payer: Medicaid Other | Admitting: Family Medicine

## 2016-12-11 ENCOUNTER — Ambulatory Visit (HOSPITAL_COMMUNITY)
Admission: RE | Admit: 2016-12-11 | Discharge: 2016-12-11 | Disposition: A | Discharge status: Home / Self Care | Payer: Medicaid Other | Source: Ambulatory Visit | Attending: Family Medicine | Admitting: Family Medicine

## 2016-12-11 VITALS — BP 119/74 | HR 89 | Temp 98.5°F | Resp 14 | Ht 61.0 in | Wt 123.0 lb

## 2016-12-11 DIAGNOSIS — G8929 Other chronic pain: Secondary | ICD-10-CM | POA: Insufficient documentation

## 2016-12-11 DIAGNOSIS — G43009 Migraine without aura, not intractable, without status migrainosus: Secondary | ICD-10-CM

## 2016-12-11 DIAGNOSIS — M545 Low back pain, unspecified: Secondary | ICD-10-CM

## 2016-12-11 DIAGNOSIS — Z118 Encounter for screening for other infectious and parasitic diseases: Secondary | ICD-10-CM

## 2016-12-11 DIAGNOSIS — Z114 Encounter for screening for human immunodeficiency virus [HIV]: Secondary | ICD-10-CM

## 2016-12-11 DIAGNOSIS — R5383 Other fatigue: Secondary | ICD-10-CM

## 2016-12-11 LAB — POCT URINALYSIS DIP (DEVICE)
BILIRUBIN URINE: NEGATIVE
GLUCOSE, UA: NEGATIVE mg/dL
Hgb urine dipstick: NEGATIVE
Ketones, ur: NEGATIVE mg/dL
LEUKOCYTES UA: NEGATIVE
NITRITE: NEGATIVE
Protein, ur: NEGATIVE mg/dL
Specific Gravity, Urine: 1.025 (ref 1.005–1.030)
UROBILINOGEN UA: 0.2 mg/dL (ref 0.0–1.0)
pH: 7 (ref 5.0–8.0)

## 2016-12-11 LAB — CBC WITH DIFFERENTIAL/PLATELET
Basophils Absolute: 0 cells/uL (ref 0–200)
Basophils Relative: 0 %
EOS ABS: 228 {cells}/uL (ref 15–500)
Eosinophils Relative: 4 %
HCT: 38.8 % (ref 35.0–45.0)
Hemoglobin: 12.4 g/dL (ref 11.7–15.5)
LYMPHS PCT: 32 %
Lymphs Abs: 1824 cells/uL (ref 850–3900)
MCH: 25.6 pg — ABNORMAL LOW (ref 27.0–33.0)
MCHC: 32 g/dL (ref 32.0–36.0)
MCV: 80 fL (ref 80.0–100.0)
MONOS PCT: 7 %
MPV: 10.4 fL (ref 7.5–12.5)
Monocytes Absolute: 399 cells/uL (ref 200–950)
NEUTROS PCT: 57 %
Neutro Abs: 3249 cells/uL (ref 1500–7800)
PLATELETS: 271 10*3/uL (ref 140–400)
RBC: 4.85 MIL/uL (ref 3.80–5.10)
RDW: 13.4 % (ref 11.0–15.0)
WBC: 5.7 10*3/uL (ref 3.8–10.8)

## 2016-12-11 LAB — COMPLETE METABOLIC PANEL WITH GFR
ALBUMIN: 4.2 g/dL (ref 3.6–5.1)
ALK PHOS: 68 U/L (ref 33–115)
ALT: 10 U/L (ref 6–29)
AST: 15 U/L (ref 10–30)
BUN: 12 mg/dL (ref 7–25)
CALCIUM: 9.5 mg/dL (ref 8.6–10.2)
CHLORIDE: 107 mmol/L (ref 98–110)
CO2: 22 mmol/L (ref 20–31)
CREATININE: 0.68 mg/dL (ref 0.50–1.10)
GFR, Est African American: >89 mL/min (ref >=60)
GFR, Est Non African American: >89 mL/min (ref >=60)
Glucose, Bld: 90 mg/dL (ref 65–99)
POTASSIUM: 4.3 mmol/L (ref 3.5–5.3)
Sodium: 140 mmol/L (ref 135–146)
Total Bilirubin: 0.3 mg/dL (ref 0.2–1.2)
Total Protein: 7.2 g/dL (ref 6.1–8.1)

## 2016-12-11 LAB — TSH: TSH: 0.82 mIU/L

## 2016-12-11 MED ORDER — BUTALBITAL-APAP-CAFFEINE 50-325-40 MG PO TABS: 1.0000 | ORAL_TABLET | Freq: Four times a day (QID) | ORAL | 1 refills | Status: DC | PRN

## 2016-12-11 NOTE — Progress Notes (Signed)
Subjective:    Patient ID: Kara Velez, female    DOB: [birth year], 26 y.o.   MRN: [id number]  Migraine   The current episode started more than 1 year ago. The problem occurs intermittently. The problem has been waxing and waning. The pain is located in the right unilateral and occipital region. The pain does not radiate. The pain quality is similar to prior headaches. The quality of the pain is described as dull. The pain is at a severity of 5/10. Associated symptoms include back pain. Pertinent negatives include no abdominal pain, abnormal behavior, anorexia, blurred vision, coughing, dizziness, drainage, ear pain, eye pain, eye redness, eye watering, facial sweating, fever, hearing loss, insomnia, loss of balance, muscle aches, nausea, neck pain, numbness, phonophobia, photophobia, rhinorrhea, scalp tenderness, sore throat, swollen glands, tinnitus, visual change, vomiting, weakness or weight loss. The symptoms are aggravated by bright light and noise. She has tried acetaminophen, darkened room and NSAIDs for the symptoms. Her past medical history is significant for migraine headaches. There is no history of cancer, cluster headaches, hypertension, immunosuppression, migraines in the family, obesity, pseudotumor cerebri, recent head traumas, sinus disease or TMJ.   Social History   Social History  . Marital status: Single    Spouse name: N/A  . Number of children: N/A  . Years of education: N/A   Occupational History  . Not on file.   Social History Main Topics  . Smoking status: Never Smoker  . Smokeless tobacco: Never Used  . Alcohol use Yes     Comment: occ   . Drug use: No     Comment: last used about 2 weeks ago   . Sexual activity: Yes    Birth control/ protection: Implant   Other Topics Concern  . Not on file   Social History Narrative  . No narrative on file   There is no immunization history on file for this patient.  Review of Systems  Constitutional: Negative.   Negative for fever and weight loss.  HENT: Negative.  Negative for ear pain, hearing loss, rhinorrhea, sore throat and tinnitus.   Eyes: Negative.  Negative for blurred vision, photophobia, pain and redness.  Respiratory: Negative.  Negative for cough.   Cardiovascular: Negative.   Gastrointestinal: Negative for abdominal pain, anorexia, nausea and vomiting.  Endocrine: Negative.   Genitourinary: Negative.   Musculoskeletal: Positive for back pain. Negative for neck pain.  Neurological: Positive for headaches. Negative for dizziness, weakness, numbness and loss of balance.  Hematological: Negative.   Psychiatric/Behavioral: Negative.  The patient does not have insomnia.        Objective:   Physical Exam  Constitutional: She is oriented to person, place, and time. She appears well-developed and well-nourished.  HENT:  Head: Normocephalic and atraumatic.  Right Ear: External ear normal.  Left Ear: External ear normal.  Mouth/Throat: Oropharynx is clear and moist.  Eyes: Conjunctivae and EOM are normal. Pupils are equal, round, and reactive to light.  Neck: Normal range of motion. Neck supple.  Cardiovascular: Normal rate, regular rhythm, normal heart sounds and intact distal pulses.  Exam reveals no gallop and no friction rub.   No murmur heard. Pulmonary/Chest: Effort normal and breath sounds normal.  Abdominal: Soft. Bowel sounds are normal.  Musculoskeletal: Normal range of motion.       Lumbar back: She exhibits normal range of motion, no tenderness, no pain and no spasm.  Neurological: She is alert and oriented to person, place, and time. She displays  normal reflexes. No cranial nerve deficit. Coordination normal.  Skin: Skin is warm and dry.  Psychiatric: She has a normal mood and affect. Her behavior is normal. Judgment and thought content normal.     BP 119/74 (BP Location: Right Arm, Patient Position: Sitting, Cuff Size: Normal)   Pulse 89   Temp 98.5 F (36.9 C) (Oral)    Resp 14   Ht 5\' 1"  (1.549 m)   Wt 123 lb (55.8 kg)   LMP 10/18/2016   SpO2 100%   BMI 23.24 kg/m  Assessment & Plan:  1. Migraine without aura and without status migrainosus, not intractable Discussed migraine headaches at length. Maintain a headache diary for 1 month. Will start a trial of Fioricet for 1 month. Wear glasses consistently - butalbital-acetaminophen-caffeine (FIORICET) 50-325-40 MG tablet; Take 1 tablet by mouth every 6 (six) hours as needed for headache.  Dispense: 45 tablet; Refill: 1  2. Chronic bilateral low back pain without sciatica - DG Lumbar Spine Complete; Future - POCT urinalysis dip (device)  3. Screening for chlamydial disease - GC/Chlamydia Probe Amp  4. Screening for HIV (human immunodeficiency virus) - HIV antibody (with reflex)  5. Fatigue, unspecified type - CBC with Differential - TSH - COMPLETE METABOLIC PANEL WITH GFR     Preventative maintenance:  Will immunizations on [facility] Last pap smear: 1 year ago (normal) Implanon: December 20, 2014 Menses Irregular Sexually active:  Chlamydia/ HIV screenings   RTC: 1 month  Tonio Seider M, FNP

## 2016-12-11 NOTE — Patient Instructions (Addendum)
Migraine Headache A migraine headache is an intense, throbbing pain on one side or both sides of the head. Migraines may also cause other symptoms, such as nausea, vomiting, and sensitivity to light and noise. What are the causes? Doing or taking certain things may also trigger migraines, such as:  Alcohol.  Smoking.  Medicines, such as: ? Medicine used to treat chest pain (nitroglycerine). ? Birth control pills. ? Estrogen pills. ? Certain blood pressure medicines.  Aged cheeses, chocolate, or caffeine.  Foods or drinks that contain nitrates, glutamate, aspartame, or tyramine.  Physical activity.  Other things that may trigger a migraine include:  Menstruation.  Pregnancy.  Hunger.  Stress, lack of sleep, too much sleep, or fatigue.  Weather changes.  What increases the risk? The following factors may make you more likely to experience migraine headaches:  Age. Risk increases with age.  Family history of migraine headaches.  Being Caucasian.  Depression and anxiety.  Obesity.  Being a woman.  Having a hole in the heart (patent foramen ovale) or other heart problems.  What are the signs or symptoms? The main symptom of this condition is pulsating or throbbing pain. Pain may:  Happen in any area of the head, such as on one side or both sides.  Interfere with daily activities.  Get worse with physical activity.  Get worse with exposure to bright lights or loud noises.  Other symptoms may include:  Nausea.  Vomiting.  Dizziness.  General sensitivity to bright lights, loud noises, or smells.  Before you get a migraine, you may get warning signs that a migraine is developing (aura). An aura may include:  Seeing flashing lights or having blind spots.  Seeing bright spots, halos, or zigzag lines.  Having tunnel vision or blurred vision.  Having numbness or a tingling feeling.  Having trouble talking.  Having muscle weakness.  How is this  diagnosed? A migraine headache can be diagnosed based on:  Your symptoms.  A physical exam.  Tests, such as CT scan or MRI of the head. These imaging tests can help rule out other causes of headaches.  Taking fluid from the spine (lumbar puncture) and analyzing it (cerebrospinal fluid analysis, or CSF analysis).  How is this treated? A migraine headache is usually treated with medicines that:  Relieve pain.  Relieve nausea.  Prevent migraines from coming back.  Treatment may also include:  Acupuncture.  Lifestyle changes like avoiding foods that trigger migraines.  Follow these instructions at home: Medicines  Take over-the-counter and prescription medicines only as told by your health care provider.  Do not drive or use heavy machinery while taking prescription pain medicine.  To prevent or treat constipation while you are taking prescription pain medicine, your health care provider may recommend that you: ? Drink enough fluid to keep your urine clear or pale yellow. ? Take over-the-counter or prescription medicines. ? Eat foods that are high in fiber, such as fresh fruits and vegetables, whole grains, and beans. ? Limit foods that are high in fat and processed sugars, such as fried and sweet foods. Lifestyle  Avoid alcohol use.  Do not use any products that contain nicotine or tobacco, such as cigarettes and e-cigarettes. If you need help quitting, ask your health care provider.  Get at least 8 hours of sleep every night.  Limit your stress. General instructions   Keep a journal to find out what may trigger your migraine headaches. For example, write down: ? What you eat and   drink. ? How much sleep you get. ? Any change to your diet or medicines.  If you have a migraine: ? Avoid things that make your symptoms worse, such as bright lights. ? It may help to lie down in a dark, quiet room. ? Do not drive or use heavy machinery. ? Ask your health care provider  what activities are safe for you while you are experiencing symptoms.  Keep all follow-up visits as told by your health care provider. This is important. Contact a health care provider if:  You develop symptoms that are different or more severe than your usual migraine symptoms. Get help right away if:  Your migraine becomes severe.  You have a fever.  You have a stiff neck.  You have vision loss.  Your muscles feel weak or like you cannot control them.  You start to lose your balance often.  You develop trouble walking.  You faint. This information is not intended to replace advice given to you by your health care provider. Make sure you discuss any questions you have with your health care provider. Document Released: 09/29/2005 Document Revised: 04/18/2016 Document Reviewed: 03/17/2016 Elsevier Interactive Patient Education  2017 Elsevier Inc.   

## 2016-12-12 DIAGNOSIS — M545 Low back pain, unspecified: Secondary | ICD-10-CM | POA: Insufficient documentation

## 2016-12-12 DIAGNOSIS — G8929 Other chronic pain: Secondary | ICD-10-CM | POA: Insufficient documentation

## 2016-12-12 DIAGNOSIS — G43009 Migraine without aura, not intractable, without status migrainosus: Secondary | ICD-10-CM | POA: Insufficient documentation

## 2016-12-12 LAB — HIV ANTIBODY (ROUTINE TESTING W REFLEX): HIV 1&2 Ab, 4th Generation: NONREACTIVE

## 2016-12-12 LAB — GC/CHLAMYDIA PROBE AMP
CT PROBE, AMP APTIMA: NOT DETECTED
GC PROBE AMP APTIMA: NOT DETECTED

## 2017-01-12 ENCOUNTER — Encounter: Payer: Self-pay | Admitting: Family Medicine

## 2017-01-12 NOTE — Progress Notes (Signed)
This encounter was created in error - please disregard.

## 2017-01-30 MED FILL — BUTALBITAL/APAP/CAFFEINE TB: 50-325-40 | 11 days supply | Qty: 45 | Fill #0

## 2017-06-27 ENCOUNTER — Inpatient Hospital Stay (HOSPITAL_COMMUNITY)
Admission: AD | Admit: 2017-06-27 | Discharge: 2017-06-27 | Disposition: A | Discharge status: Home / Self Care | Payer: 59 | Source: Ambulatory Visit | Attending: Obstetrics and Gynecology | Admitting: Obstetrics and Gynecology

## 2017-06-27 DIAGNOSIS — N939 Abnormal uterine and vaginal bleeding, unspecified: Secondary | ICD-10-CM | POA: Insufficient documentation

## 2017-06-27 DIAGNOSIS — N938 Other specified abnormal uterine and vaginal bleeding: Secondary | ICD-10-CM

## 2017-06-27 LAB — URINALYSIS, ROUTINE W REFLEX MICROSCOPIC
BILIRUBIN URINE: NEGATIVE
Bacteria, UA: NONE SEEN
Glucose, UA: NEGATIVE mg/dL
Ketones, ur: NEGATIVE mg/dL
LEUKOCYTES UA: NEGATIVE
NITRITE: NEGATIVE
PH: 7 (ref 5.0–8.0)
Protein, ur: NEGATIVE mg/dL
SPECIFIC GRAVITY, URINE: 1.018 (ref 1.005–1.030)

## 2017-06-27 LAB — CBC WITH DIFFERENTIAL/PLATELET
Basophils Absolute: 0 10*3/uL (ref 0.0–0.1)
Basophils Relative: 0 %
Eosinophils Absolute: 0.2 10*3/uL (ref 0.0–0.7)
Eosinophils Relative: 5 %
HCT: 38.6 % (ref 36.0–46.0)
Hemoglobin: 12.5 g/dL (ref 12.0–15.0)
Lymphocytes Relative: 34 %
Lymphs Abs: 1.4 10*3/uL (ref 0.7–4.0)
MCH: 26 pg (ref 26.0–34.0)
MCHC: 32.4 g/dL (ref 30.0–36.0)
MCV: 80.2 fL (ref 78.0–100.0)
Monocytes Absolute: 0.2 10*3/uL (ref 0.1–1.0)
Monocytes Relative: 4 %
Neutro Abs: 2.4 10*3/uL (ref 1.7–7.7)
Neutrophils Relative %: 57 %
Platelets: 231 10*3/uL (ref 150–400)
RBC: 4.81 MIL/uL (ref 3.87–5.11)
RDW: 13.3 % (ref 11.5–15.5)
WBC: 4.2 10*3/uL (ref 4.0–10.5)

## 2017-06-27 LAB — POCT PREGNANCY, URINE: Preg Test, Ur: NEGATIVE

## 2017-06-27 LAB — PREGNANCY, URINE: Preg Test, Ur: NEGATIVE

## 2017-06-27 NOTE — Discharge Instructions (Signed)
Dysfunctional Uterine Bleeding °Dysfunctional uterine bleeding is abnormal bleeding from the uterus. Dysfunctional uterine bleeding includes: °· A period that comes earlier or later than usual. °· A period that is lighter, heavier, or has blood clots. °· Bleeding between periods. °· Skipping one or more periods. °· Bleeding after sexual intercourse. °· Bleeding after menopause. ° °Follow these instructions at home: °Pay attention to any changes in your symptoms. Follow these instructions to help with your condition: °Eating and drinking °· Eat well-balanced meals. Include foods that are high in iron, such as liver, meat, shellfish, green leafy vegetables, and eggs. °· If you become constipated: °? Drink plenty of water. °? Eat fruits and vegetables that are high in water and fiber, such as spinach, carrots, raspberries, apples, and mango. °Medicines °· Take over-the-counter and prescription medicines only as told by your health care provider. °· Do not change medicines without talking with your health care provider. °· Aspirin or medicines that contain aspirin may make the bleeding worse. Do not take those medicines: °? During the week before your period. °? During your period. °· If you were prescribed iron pills, take them as told by your health care provider. Iron pills help to replace iron that your body loses because of this condition. °Activity °· If you need to change your sanitary pad or tampon more than one time every 2 hours: °? Lie in bed with your feet raised (elevated). °? Place a cold pack on your lower abdomen. °? Rest as much as possible until the bleeding stops or slows down. °· Do not try to lose weight until the bleeding has stopped and your blood iron level is back to normal. °Other Instructions °· For two months, write down: °? When your period starts. °? When your period ends. °? When any abnormal bleeding occurs. °? What problems you notice. °· Keep all follow up visits as told by your health  care provider. This is important. °Contact a health care provider if: °· You get light-headed or weak. °· You have nausea and vomiting. °· You cannot eat or drink without vomiting. °· You feel dizzy or have diarrhea while you are taking medicines. °· You are taking birth control pills or hormones, and you want to change them or stop taking them. °Get help right away if: °· You develop a fever or chills. °· You need to change your sanitary pad or tampon more than one time per hour. °· Your bleeding becomes heavier, or your flow contains clots more often. °· You develop pain in your abdomen. °· You lose consciousness. °· You develop a rash. °This information is not intended to replace advice given to you by your health care provider. Make sure you discuss any questions you have with your health care provider. °Document Released: 09/26/2000 Document Revised: 03/06/2016 Document Reviewed: 12/25/2014 °Elsevier Interactive Patient Education © 2018 Elsevier Inc. ° °

## 2017-06-27 NOTE — MAU Provider Note (Signed)
History   26 yo BF in with c/o bleeding x 1 month. States is heavy off and on. nexplanon as contraceptive.   CSN: [id number]  Arrival date & time 06/27/17  1237   None     Chief Complaint  Patient presents with  . Vaginal Bleeding    HPI  No past medical history on file.  Past Surgical History:  Procedure Laterality Date  . extraction of wisdom teeth    . WISDOM TOOTH EXTRACTION     three removed    Family History  Problem Relation Age of Onset  . Hypertension Other   . Cancer Other   . Diabetes Other     Social History  Substance Use Topics  . Smoking status: Never Smoker  . Smokeless tobacco: Never Used  . Alcohol use Yes     Comment: occ     OB History    No data available      Review of Systems  Constitutional: Negative.   HENT: Negative.   Eyes: Negative.   Respiratory: Negative.   Cardiovascular: Negative.   Gastrointestinal: Negative.   Endocrine: Negative.   Genitourinary: Positive for vaginal bleeding.  Musculoskeletal: Negative.   Skin: Negative.   Allergic/Immunologic: Negative.   Neurological: Negative.   Hematological: Negative.   Psychiatric/Behavioral: Negative.     Allergies  Patient has no known allergies.  Home Medications    There were no vitals taken for this visit.  Physical Exam  Constitutional: She is oriented to person, place, and time. She appears well-developed and well-nourished.  HENT:  Head: Normocephalic.  Eyes: Pupils are equal, round, and reactive to light.  Neck: Normal range of motion.  Cardiovascular: Normal rate, regular rhythm, normal heart sounds and intact distal pulses.   Pulmonary/Chest: Effort normal and breath sounds normal.  Abdominal: Soft. Bowel sounds are normal.  Genitourinary: Vagina normal and uterus normal.  Musculoskeletal: Normal range of motion.  Neurological: She is alert and oriented to person, place, and time. She has normal reflexes.  Skin: Skin is warm and dry.  Psychiatric:  She has a normal mood and affect. Her behavior is normal. Judgment and thought content normal.    MAU Course  Procedures (including critical care time)  Labs Reviewed  PREGNANCY, URINE  URINALYSIS, ROUTINE W REFLEX MICROSCOPIC  CBC WITH DIFFERENTIAL/PLATELET  POCT PREGNANCY, URINE   No results found.   1. DUB (dysfunctional uterine bleeding)       MDM  UPT neg, VSS, Abd soft non tender. Scant amt bright vag bleeding. Exam totally normal. Discussed importance of following up with her provider in regards to her DX of DUB to discuss further treatment options. CBC stable. Pt to follow up with her OB GYN regarding nexplanon removal

## 2017-06-27 NOTE — Progress Notes (Addendum)
Bleeding for past 2 months. When enquired why she didn't see ob provider, said she just got a new doc. Denies being pregnant. UPT ordered with [facility].   1254: Provider at bs assessing pt  Labs back and all normal. Provider made aware. Discharge instructions received.   1437: Discharge instructions given with pt understanding. Pt left unit via ambulatory

## 2017-07-08 ENCOUNTER — Ambulatory Visit: Payer: 59 | Admitting: Family Medicine

## 2017-07-15 ENCOUNTER — Encounter (INDEPENDENT_AMBULATORY_CARE_PROVIDER_SITE_OTHER): Payer: 59 | Admitting: Family Medicine

## 2017-07-15 ENCOUNTER — Encounter: Payer: Self-pay | Admitting: Family Medicine

## 2017-07-15 VITALS — BP 123/67 | HR 86 | Temp 98.2°F | Resp 16 | Ht 61.0 in | Wt 125.0 lb

## 2017-07-15 DIAGNOSIS — Z3046 Encounter for surveillance of implantable subdermal contraceptive: Secondary | ICD-10-CM

## 2017-07-17 NOTE — Progress Notes (Signed)
This encounter was created in error - please disregard.

## 2017-07-31 ENCOUNTER — Ambulatory Visit (INDEPENDENT_AMBULATORY_CARE_PROVIDER_SITE_OTHER): Payer: 59 | Admitting: Family Medicine

## 2017-07-31 ENCOUNTER — Encounter: Payer: Self-pay | Admitting: Family Medicine

## 2017-07-31 VITALS — BP 114/73 | HR 85 | Temp 98.1°F | Ht 61.0 in | Wt 125.0 lb

## 2017-07-31 DIAGNOSIS — Z3046 Encounter for surveillance of implantable subdermal contraceptive: Secondary | ICD-10-CM

## 2017-07-31 MED ORDER — IBUPROFEN 600 MG PO TABS: 600.0000 mg | ORAL_TABLET | Freq: Three times a day (TID) | ORAL | 0 refills | Status: DC | PRN

## 2017-07-31 MED ORDER — LIDOCAINE HCL 0.5 % IJ SOLN: 5.0000 mL | Freq: Once | INTRAMUSCULAR | Status: AC

## 2017-07-31 NOTE — Patient Instructions (Signed)
Keep area clean and dry. Maintain pressure dressing for 24 hours. Keep steri strips in place for 3-5 days. Will sent up an xray for visualization of Nexplanon in 1 week. Please report to office for increased pain. Recommend Ibuprofen 600 mg every 8 hours for mild to severe pain.  Etonogestrel implant What is this medicine? ETONOGESTREL (et oh noe JES trel) is a contraceptive (birth control) device. It is used to prevent pregnancy. It can be used for up to 3 years. This medicine may be used for other purposes; ask your health care provider or pharmacist if you have questions. COMMON BRAND NAME(S): Implanon, Nexplanon What should I tell my health care provider before I take this medicine? They need to know if you have any of these conditions: -abnormal vaginal bleeding -blood vessel disease or blood clots -cancer of the breast, cervix, or liver -depression -diabetes -gallbladder disease -headaches -heart disease or recent heart attack -high blood pressure -high cholesterol -kidney disease -liver disease -renal disease -seizures -tobacco smoker -an unusual or allergic reaction to etonogestrel, other hormones, anesthetics or antiseptics, medicines, foods, dyes, or preservatives -pregnant or trying to get pregnant -breast-feeding How should I use this medicine? This device is inserted just under the skin on the inner side of your upper arm by a health care professional. Talk to your pediatrician regarding the use of this medicine in children. Special care may be needed. Overdosage: If you think you have taken too much of this medicine contact a poison control center or emergency room at once. NOTE: This medicine is only for you. Do not share this medicine with others. What if I miss a dose? This does not apply. What may interact with this medicine? Do not take this medicine with any of the following medications: -amprenavir -bosentan -fosamprenavir This medicine may also interact  with the following medications: -barbiturate medicines for inducing sleep or treating seizures -certain medicines for fungal infections like ketoconazole and itraconazole -grapefruit juice -griseofulvin -medicines to treat seizures like carbamazepine, felbamate, oxcarbazepine, phenytoin, topiramate -modafinil -phenylbutazone -rifampin -rufinamide -some medicines to treat HIV infection like atazanavir, indinavir, lopinavir, nelfinavir, tipranavir, ritonavir -St. John's wort This list may not describe all possible interactions. Give your health care provider a list of all the medicines, herbs, non-prescription drugs, or dietary supplements you use. Also tell them if you smoke, drink alcohol, or use illegal drugs. Some items may interact with your medicine. What should I watch for while using this medicine? This product does not protect you against HIV infection (AIDS) or other sexually transmitted diseases. You should be able to feel the implant by pressing your fingertips over the skin where it was inserted. Contact your doctor if you cannot feel the implant, and use a non-hormonal birth control method (such as condoms) until your doctor confirms that the implant is in place. If you feel that the implant may have broken or become bent while in your arm, contact your healthcare provider. What side effects may I notice from receiving this medicine? Side effects that you should report to your doctor or health care professional as soon as possible: -allergic reactions like skin rash, itching or hives, swelling of the face, lips, or tongue -breast lumps -changes in emotions or moods -depressed mood -heavy or prolonged menstrual bleeding -pain, irritation, swelling, or bruising at the insertion site -scar at site of insertion -signs of infection at the insertion site such as fever, and skin redness, pain or discharge -signs of pregnancy -signs and symptoms of a blood  clot such as breathing  problems; changes in vision; chest pain; severe, sudden headache; pain, swelling, warmth in the leg; trouble speaking; sudden numbness or weakness of the face, arm or leg -signs and symptoms of liver injury like dark yellow or brown urine; general ill feeling or flu-like symptoms; light-colored stools; loss of appetite; nausea; right upper belly pain; unusually weak or tired; yellowing of the eyes or skin -unusual vaginal bleeding, discharge -signs and symptoms of a stroke like changes in vision; confusion; trouble speaking or understanding; severe headaches; sudden numbness or weakness of the face, arm or leg; trouble walking; dizziness; loss of balance or coordination Side effects that usually do not require medical attention (report to your doctor or health care professional if they continue or are bothersome): -acne -back pain -breast pain -changes in weight -dizziness -general ill feeling or flu-like symptoms -headache -irregular menstrual bleeding -nausea -sore throat -vaginal irritation or inflammation This list may not describe all possible side effects. Call your doctor for medical advice about side effects. You may report side effects to FDA at 1-800-FDA-1088. Where should I keep my medicine? This drug is given in a hospital or clinic and will not be stored at home. NOTE: This sheet is a summary. It may not cover all possible information. If you have questions about this medicine, talk to your doctor, pharmacist, or health care provider.  2018 Elsevier/Gold Standard (2016-04-17 11:19:22)

## 2017-07-31 NOTE — Progress Notes (Signed)
Kara Velez, a 26 year old, very pleasant female that presents for Nexplanon removal. Patient has Nexplanon since December 06, 2015. She says that she is interested in a new patch that is out for birth control. She is not sexually active. She does not have normal menstrual cycles. [facility] is left inner arm [facility] Patient feels well and is without complaint. Patient presents for Nexplanon removal. She says that she had nexplanon inserted at the local health department.   Patient has consented having Nexplanon removed in office.   Past Medical History:  Diagnosis Date  . Migraine headache   . Nexplanon in place    Social History   Social History  . Marital status: Single    Spouse name: N/A  . Number of children: N/A  . Years of education: N/A   Occupational History  . Not on file.   Social History Main Topics  . Smoking status: Never Smoker  . Smokeless tobacco: Never Used  . Alcohol use Yes     Comment: occ   . Drug use: No     Comment: last used about 2 weeks ago   . Sexual activity: Yes    Birth control/ protection: Implant   Other Topics Concern  . Not on file   Social History Narrative  . No narrative on file   Review of Systems  Constitutional: Negative.   HENT: Negative.   Eyes: Negative.   Respiratory: Negative.   Cardiovascular: Negative.   Gastrointestinal: Negative.   Genitourinary: Negative.   Musculoskeletal: Negative.   Skin: Negative.   Neurological: Negative.   Endo/Heme/Allergies: Negative.   Psychiatric/Behavioral: Negative.    Nexplanon Removal:  Palpated Nexplanon in left upper extremity.  Marked area with sterile pen Kara Joaquin CourtsKimberly Harris, FNP palpated site to ensure marking was correct.  Used Lidocaine 0.5 ml anesthetic to area prior to incision 2 mm vertical incision made Was unable to remove rod. Was unable to visualize rod.  Used steri strips to close incision and  applied pressure dressing  Plan   Nexplanon removal Maintain pressure dressing for 24 hours Keep steri strips in place for 3-5 days Will schedule an ultrasound of left upper extremity to locate Nexplanon Will notify interventional radiology on 10/21 - ibuprofen (ADVIL,MOTRIN) 600 MG tablet; Take 1 tablet (600 mg total) by mouth every 8 (eight) hours as needed.  Dispense: 30 tablet; Refill: 0   Patient in stable condition   Nolon NationsLaChina Moore Ashe Graybeal  MSN, FNP-C Patient [facility] [address]  [place], [place] [id number] [phone]

## 2017-10-02 ENCOUNTER — Ambulatory Visit: Payer: Self-pay | Admitting: Family Medicine

## 2017-11-24 ENCOUNTER — Encounter: Payer: Self-pay | Admitting: Family Medicine

## 2017-11-24 ENCOUNTER — Ambulatory Visit (INDEPENDENT_AMBULATORY_CARE_PROVIDER_SITE_OTHER): Payer: Self-pay | Admitting: Family Medicine

## 2017-11-24 VITALS — BP 106/74 | HR 80 | Temp 97.9°F | Resp 16 | Ht 61.0 in | Wt 127.0 lb

## 2017-11-24 DIAGNOSIS — B349 Viral infection, unspecified: Secondary | ICD-10-CM

## 2017-11-24 LAB — POCT URINALYSIS DIP (DEVICE)
Bilirubin Urine: NEGATIVE
Glucose, UA: NEGATIVE mg/dL
Ketones, ur: NEGATIVE mg/dL
LEUKOCYTES UA: NEGATIVE
NITRITE: NEGATIVE
PH: 7 (ref 5.0–8.0)
PROTEIN: 30 mg/dL — AB
Specific Gravity, Urine: 1.025 (ref 1.005–1.030)
UROBILINOGEN UA: 1 mg/dL (ref 0.0–1.0)

## 2017-11-24 NOTE — Progress Notes (Signed)
Patient ID: Kara Velez, female    DOB: 04/13/1991, [age][address][age] y.o.   MRN: [id number]  PCP: Massie MaroonHollis, Lachina M, FNP  Chief Complaint  Patient presents with  . Emesis  . Constipation    Subjective:  HPI Kara Velez is a 27 y.o. female presents for evaluation of nausea and vomiting. She became ill x 2 days prior with stomach pain, constipation, and nausea, and vomiting. She vomited twice and not again since previous 2 days. 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 Today she denies experiencing fever, backaches, any other episodes of vomiting, diarrhea or cough.  Social History   Socioeconomic History  . Marital status: Single    Spouse name: Not on file  . Number of children: Not on file  . Years of education: Not on file  . Highest education level: Not on file  Social Needs  . Financial resource strain: Not on file  . Food insecurity - worry: Not on file  . Food insecurity - inability: Not on file  . Transportation needs - medical: Not on file  . Transportation needs - non-medical: Not on file  Occupational History  . Not on file  Tobacco Use  . Smoking status: Never Smoker  . Smokeless tobacco: Never Used  Substance and Sexual Activity  . Alcohol use: Yes    Comment: occ   . Drug use: No    Comment: last used about 2 weeks ago   . Sexual activity: Yes    Birth control/protection: Implant  Other Topics Concern  . Not on file  Social History Narrative  . Not on file    Family History  Problem Relation Age of Onset  . Hypertension Other   . Cancer Other   . Diabetes Other    Review of Systems Constitutional: Negative for fever, chills, diaphoresis, activity change, appetite change and fatigue. HENT: Negative for ear pain, nosebleeds, congestion, facial swelling, rhinorrhea, neck pain, neck stiffness and ear discharge.  Eyes: Negative for pain, discharge, redness, itching and visual  disturbance. Respiratory: Negative for cough, choking, chest tightness, shortness of breath, wheezing and stridor.  Cardiovascular: Negative for chest pain, palpitations and leg swelling. Gastrointestinal: Negative for abdominal distention. Genitourinary: Negative for dysuria, urgency, frequency, hematuria, flank pain, decreased urine volume, difficulty urinating and dyspareunia.  Musculoskeletal: Negative for back pain, joint swelling, arthralgia and gait problem. Neurological: Negative for dizziness, tremors, seizures, syncope, facial asymmetry, speech difficulty, weakness, light-headedness, numbness and headaches.  Hematological: Negative for adenopathy. Does not bruise/bleed easily. Psychiatric/Behavioral: Negative for hallucinations, behavioral problems, confusion, dysphoric mood, decreased concentration and agitation.  Patient Active Problem List   Diagnosis Date Noted  . Migraine without aura and without status migrainosus, not intractable 12/12/2016  . Chronic bilateral low back pain without sciatica 12/12/2016  . Nexplanon in place 04/11/2016  . Frequent headaches 04/11/2016    No Known Allergies  Prior to Admission medications   Medication Sig Start Date End Date Taking? Authorizing Provider  Biotin w/ Vitamins C & E (HAIR SKIN & NAILS GUMMIES PO) Take 1 each by mouth daily.   Yes [provider]  etonogestrel (NEXPLANON) 68 MG IMPL implant 1 each by Subdermal route once.   Yes [provider]  ibuprofen (ADVIL,MOTRIN) 600 MG tablet Take 1 tablet (600 mg total) by mouth every 8 (eight) hours as needed. 07/31/17  Yes Massie MaroonHollis, Lachina M, FNP  Prenatal Vit-Fe Phos-FA-Omega (VITAFOL GUMMIES) 3.33-0.333-34.8  MG CHEW Chew 3 tablets by mouth daily. Patient not taking: Reported on 12/11/2016 04/11/16   Roe Coombs, CNM    Past Medical, Surgical Family and Social History reviewed and updated.    Objective:   Today's Vitals   11/24/17 1457  BP: 106/74  Pulse:  80  Resp: 16  Temp: 97.9 F (36.6 C)  TempSrc: Oral  SpO2: 100%  Weight: 127 lb (57.6 kg)  Height: 5\' 1"  (1.549 m)    Wt Readings from Last 3 Encounters:  11/24/17 127 lb (57.6 kg)  07/31/17 125 lb (56.7 kg)  07/15/17 125 lb (56.7 kg)    Physical Exam Constitutional: Patient appears well-developed and well-nourished. No distress. HENT: Normocephalic, atraumatic, External right and left ear normal. Oropharynx is clear and moist.  Eyes: Conjunctivae and EOM are normal. PERRLA, no scleral icterus. Neck: Normal ROM. Neck supple. No JVD. No tracheal deviation. No thyromegaly. CVS: RRR, S1/S2 +, no murmurs, no gallops, no carotid bruit.  Pulmonary: Effort and breath sounds normal, no stridor, rhonchi, wheezes, rales.  Abdominal: Soft. BS +, no distension, tenderness, rebound or guarding.  Musculoskeletal: Normal range of motion. No edema and no tenderness.  Lymphadenopathy: No lymphadenopathy noted, cervical, inguinal or axillary Neuro: Alert. Normal reflexes, muscle tone coordination. No cranial nerve deficit. Skin: Skin is warm and dry. No rash noted. Not diaphoretic. No erythema. No pallor. Psychiatric: Normal mood and affect. Behavior, judgment, thought content normal.   Assessment & Plan:  1. Viral illness, resolved. Provided a work note advising okay return to work.   Follow-up as needed.  Godfrey Pick. Tiburcio Pea, MSN, FNP-C [facility]  [address] [place], [place] [address] [phone]

## 2017-12-02 MED FILL — AMOXICILLIN 500 MG CAPSULE: 500 | 7 days supply | Qty: 21 | Fill #0

## 2018-01-11 MED FILL — IBUPROFEN 800 MG TAB: 800 | 8 days supply | Qty: 20 | Fill #0

## 2018-01-11 MED FILL — AMOXICILLIN 500 MG CAPSULE: 500 | 7 days supply | Qty: 28 | Fill #0

## 2018-05-06 MED FILL — IBUPROFEN 800 MG TAB: 800 | 5 days supply | Qty: 20 | Fill #1

## 2018-05-10 ENCOUNTER — Telehealth (HOSPITAL_COMMUNITY): Payer: Self-pay | Admitting: Emergency Medicine

## 2018-05-10 ENCOUNTER — Ambulatory Visit: Payer: Self-pay | Admitting: Family Medicine

## 2018-05-10 ENCOUNTER — Ambulatory Visit (HOSPITAL_COMMUNITY)
Admission: EM | Admit: 2018-05-10 | Discharge: 2018-05-10 | Disposition: A | Discharge status: Home / Self Care | Payer: Self-pay | Attending: Family Medicine | Admitting: Family Medicine

## 2018-05-10 ENCOUNTER — Encounter (HOSPITAL_COMMUNITY): Payer: Self-pay | Admitting: Emergency Medicine

## 2018-05-10 DIAGNOSIS — R05 Cough: Secondary | ICD-10-CM

## 2018-05-10 DIAGNOSIS — J069 Acute upper respiratory infection, unspecified: Secondary | ICD-10-CM

## 2018-05-10 DIAGNOSIS — B9789 Other viral agents as the cause of diseases classified elsewhere: Secondary | ICD-10-CM

## 2018-05-10 DIAGNOSIS — J029 Acute pharyngitis, unspecified: Secondary | ICD-10-CM

## 2018-05-10 MED ORDER — IPRATROPIUM BROMIDE 0.06 % NA SOLN: 2.0000 | Freq: Four times a day (QID) | NASAL | 12 refills | Status: DC

## 2018-05-10 MED ORDER — DM-GUAIFENESIN ER 30-600 MG PO TB12: 1.0000 | ORAL_TABLET | Freq: Two times a day (BID) | ORAL | 0 refills | Status: DC

## 2018-05-10 MED ORDER — DM-GUAIFENESIN ER 30-600 MG PO TB12: 1.0000 | ORAL_TABLET | Freq: Two times a day (BID) | ORAL | 0 refills | Status: AC

## 2018-05-10 MED FILL — MUCINEX DM ER 600-30 MG TAB: 30-600 | 5 days supply | Qty: 10 | Fill #0

## 2018-05-10 NOTE — ED Triage Notes (Signed)
Pt sts URI sx with cough and blood tinged sputum

## 2018-05-10 NOTE — Telephone Encounter (Signed)
Pt requesting her medications be sent to the [facility].  Medications changed to new pharmacy.

## 2018-05-10 NOTE — Discharge Instructions (Signed)
Push fluids to ensure adequate hydration and keep secretions thin.  Tylenol and/or ibuprofen as needed for pain or fevers.   Continue with over the counter treatments as needed for symptoms, nyquil at night for example may be helpful.  Nasal spray 2-4 times a day to help with post nasal drip.  If symptoms worsen or do not improve in the next week to return to be seen or to follow up with your PCP.

## 2018-05-10 NOTE — ED Provider Notes (Signed)
[facility]    CSN: [id number] Arrival date & time: 05/10/18  1313     History   Chief Complaint Chief Complaint  Patient presents with  . URI    HPI Kara Velez is a 27 y.o. female.   Kara Velez presents with complaints of cough and sore throat which started yesterday. Hard time sleeping last night due to cough. No fevers. No congestion or ear pain. No gi/gu complaints. No rash. Noted some specks of bright red blood in her sputum this morning, none since. No recent international travel. Just came from [place] after attending a wedding. No specific known ill contacts. Does not smoke. No history of asthma. Tried using Ricola which did not help. No shortness of breath. She uses the patch for birth control. No chest pain . No leg pain or swelling. Without contributing medical history.      ROS per HPI.      Past Medical History:  Diagnosis Date  . Migraine headache   . Nexplanon in place     Patient Active Problem List   Diagnosis Date Noted  . Migraine without aura and without status migrainosus, not intractable 12/12/2016  . Chronic bilateral low back pain without sciatica 12/12/2016  . Nexplanon in place 04/11/2016  . Frequent headaches 04/11/2016    Past Surgical History:  Procedure Laterality Date  . extraction of wisdom teeth    . WISDOM TOOTH EXTRACTION     three removed    OB History   None      Home Medications    Prior to Admission medications   Medication Sig Start Date End Date Taking? Authorizing Provider  dextromethorphan-guaiFENesin (MUCINEX DM) 30-600 MG 12hr tablet Take 1 tablet by mouth 2 (two) times daily for 5 days. 05/10/18 05/15/18  Linus Mako B, NP  ipratropium (ATROVENT) 0.06 % nasal spray Place 2 sprays into both nostrils 4 (four) times daily. 05/10/18   Georgetta Haber, NP    Family History Family History  Problem Relation Age of Onset  . Hypertension Other   . Cancer Other   . Diabetes Other     Social  History Social History   Tobacco Use  . Smoking status: Never Smoker  . Smokeless tobacco: Never Used  Substance Use Topics  . Alcohol use: Yes    Comment: occ   . Drug use: No    Types: Marijuana    Comment: last used about 2 weeks ago      Allergies   Patient has no known allergies.   Review of Systems Review of Systems   Physical Exam Triage Vital Signs ED Triage Vitals [05/10/18 1338]  Enc Vitals Group     BP 112/80     Pulse Rate 97     Resp 18     Temp 98.4 F (36.9 C)     Temp Source Oral     SpO2 99 %     Weight      Height      Head Circumference      Peak Flow      Pain Score      Pain Loc      Pain Edu?      Excl. in GC?    No data found.  Updated Vital Signs BP 112/80 (BP Location: Left Arm)   Pulse 97   Temp 98.4 F (36.9 C) (Oral)   Resp 18   SpO2 99%    Physical Exam  Constitutional:  She is oriented to person, place, and time. She appears well-developed and well-nourished. No distress.  HENT:  Head: Normocephalic and atraumatic.  Right Ear: Tympanic membrane, external ear and ear canal normal.  Left Ear: Tympanic membrane, external ear and ear canal normal.  Nose: Nose normal.  Mouth/Throat: Uvula is midline, oropharynx is clear and moist and mucous membranes are normal. No tonsillar exudate.  Eyes: Pupils are equal, round, and reactive to light. Conjunctivae and EOM are normal.  Cardiovascular: Normal rate, regular rhythm and normal heart sounds.  Pulmonary/Chest: Effort normal and breath sounds normal.  Neurological: She is alert and oriented to person, place, and time.  Skin: Skin is warm and dry.     UC Treatments / Results  Labs (all labs ordered are listed, but only abnormal results are displayed) Labs Reviewed - No data to display  EKG None  Radiology No results found.  Procedures Procedures (including critical care time)  Medications Ordered in UC Medications - No data to display  Initial Impression /  Assessment and Plan / UC Course  I have reviewed the triage vital signs and the nursing notes.  Pertinent labs & imaging results that were available during my care of the patient were reviewed by me and considered in my medical decision making (see chart for details).     Non toxic. Afebrile. No cough throughout exam. No other risks for PE or other infectious lung disease. History and physical consistent with viral illness.  Supportive cares recommended. If symptoms worsen or do not improve in the next week to return to be seen or to follow up with PCP.  Patient verbalized understanding and agreeable to plan.    Final Clinical Impressions(s) / UC Diagnoses   Final diagnoses:  Viral URI with cough     Discharge Instructions     Push fluids to ensure adequate hydration and keep secretions thin.  Tylenol and/or ibuprofen as needed for pain or fevers.   Continue with over the counter treatments as needed for symptoms, nyquil at night for example may be helpful.  Nasal spray 2-4 times a day to help with post nasal drip.  If symptoms worsen or do not improve in the next week to return to be seen or to follow up with your PCP.     ED Prescriptions    Medication Sig Dispense Auth. Provider   dextromethorphan-guaiFENesin (MUCINEX DM) 30-600 MG 12hr tablet Take 1 tablet by mouth 2 (two) times daily for 5 days. 10 tablet Linus MakoBurky, Natalie B, NP   ipratropium (ATROVENT) 0.06 % nasal spray Place 2 sprays into both nostrils 4 (four) times daily. 15 mL Linus MakoBurky, Natalie B, NP     Controlled Substance Prescriptions Page Controlled Substance Registry consulted? Not Applicable   Georgetta HaberBurky, Natalie B, NP 05/10/18 1432

## 2018-05-11 ENCOUNTER — Encounter: Payer: Self-pay | Admitting: Family Medicine

## 2018-05-11 ENCOUNTER — Ambulatory Visit (INDEPENDENT_AMBULATORY_CARE_PROVIDER_SITE_OTHER): Payer: Self-pay | Admitting: Family Medicine

## 2018-05-11 VITALS — BP 120/78 | HR 84 | Temp 98.4°F | Ht 61.0 in | Wt 131.6 lb

## 2018-05-11 DIAGNOSIS — R059 Cough, unspecified: Secondary | ICD-10-CM

## 2018-05-11 DIAGNOSIS — R0981 Nasal congestion: Secondary | ICD-10-CM

## 2018-05-11 DIAGNOSIS — R05 Cough: Secondary | ICD-10-CM

## 2018-05-11 DIAGNOSIS — Z09 Encounter for follow-up examination after completed treatment for conditions other than malignant neoplasm: Secondary | ICD-10-CM

## 2018-05-11 DIAGNOSIS — J0100 Acute maxillary sinusitis, unspecified: Secondary | ICD-10-CM

## 2018-05-11 MED ORDER — AMOXICILLIN-POT CLAVULANATE 875-125 MG PO TABS: 1.0000 | ORAL_TABLET | Freq: Two times a day (BID) | ORAL | 0 refills | Status: DC

## 2018-05-11 MED ORDER — SALINE SPRAY 0.65 % NA SOLN: 1.0000 | NASAL | 2 refills | Status: DC | PRN

## 2018-05-11 MED FILL — AMOX-CLAV 875-125 MG TABLET: 875-125 | 7 days supply | Qty: 14 | Fill #0

## 2018-05-11 NOTE — Progress Notes (Signed)
Sick Visit  Subjective:    Patient ID: Kara Velez, female    DOB: 07-25-1991, 27 y.o.   MRN: [id number]   Chief Complaint  Patient presents with  . Cough  . Sore Throat  . Nasal Congestion     HPI  Past Medical History:  Diagnosis Date  . Migraine headache   . Nexplanon in place     Current Status: She states that she has been having congestion, sore throat, and cough for a few days now. She has use Mucinex and states that it has not help to improve her symptoms. No chest pain, heart palpitations, and shortness of breath reported. She is in no respiratory distress today.   Since her last office visit, she is doing well with no complaints. She denies fevers, chills, fatigue, recent infections, weight loss, and night sweats. She has not had any headaches, visual changes, dizziness, and falls.  No reports of GI problems such as nausea, vomiting, diarrhea, and constipation. She has no reports of blood in stools, dysuria and hematuria. No depression or anxiety, and denies suicidal ideations, homicidal ideations, or auditory hallucinations. She denies pain today.   Past Medical History:  Diagnosis Date  . Migraine headache   . Nexplanon in place     Family History  Problem Relation Age of Onset  . Hypertension Other   . Cancer Other   . Diabetes Other     Social History   Socioeconomic History  . Marital status: Single    Spouse name: Not on file  . Number of children: Not on file  . Years of education: Not on file  . Highest education level: Not on file  Occupational History  . Not on file  Social Needs  . Financial resource strain: Not on file  . Food insecurity:    Worry: Not on file    Inability: Not on file  . Transportation needs:    Medical: Not on file    Non-medical: Not on file  Tobacco Use  . Smoking status: Never Smoker  . Smokeless tobacco: Never Used  Substance and Sexual Activity  . Alcohol use: Yes    Comment: occ   . Drug use: No   Types: Marijuana    Comment: last used about 2 weeks ago   . Sexual activity: Yes    Birth control/protection: Implant  Lifestyle  . Physical activity:    Days per week: Not on file    Minutes per session: Not on file  . Stress: Not on file  Relationships  . Social connections:    Talks on phone: Not on file    Gets together: Not on file    Attends religious service: Not on file    Active member of club or organization: Not on file    Attends meetings of clubs or organizations: Not on file    Relationship status: Not on file  . Intimate partner violence:    Fear of current or ex partner: Not on file    Emotionally abused: Not on file    Physically abused: Not on file    Forced sexual activity: Not on file  Other Topics Concern  . Not on file  Social History Narrative  . Not on file    Past Surgical History:  Procedure Laterality Date  . extraction of wisdom teeth    . WISDOM TOOTH EXTRACTION     three removed    There is no immunization history on file for  this patient.  Current Meds  Medication Sig  . dextromethorphan-guaiFENesin (MUCINEX DM) 30-600 MG 12hr tablet Take 1 tablet by mouth 2 (two) times daily for 5 days.   Current Facility-Administered Medications for the 05/11/18 encounter (Office Visit) with Kallie Locks, FNP  Medication  . lidocaine (XYLOCAINE) 0.5 % (with pres) injection 5 mL   No Known Allergies  BP 120/78 (BP Location: Left Arm, Patient Position: Sitting, Cuff Size: Small)   Pulse 84   Temp 98.4 F (36.9 C) (Oral)   Ht 5\' 1"  (1.549 m)   Wt 131 lb 9.6 oz (59.7 kg)   LMP 04/15/2018   SpO2 99%   BMI 24.87 kg/m    Review of Systems  Constitutional: Negative.   HENT: Positive for congestion.        Sore throat  Eyes: Negative.   Respiratory: Positive for cough.   Gastrointestinal: Positive for diarrhea (mild).  Endocrine: Negative.   Genitourinary: Negative.   Musculoskeletal: Negative.   Skin: Negative.   Allergic/Immunologic:  Negative.   Neurological: Negative.   Hematological: Negative.   Psychiatric/Behavioral: Negative.    Objective:   Physical Exam  Constitutional: She is oriented to person, place, and time. She appears well-developed and well-nourished.  Eyes: Pupils are equal, round, and reactive to light. EOM are normal.  Neck: Normal range of motion. Neck supple.  Cardiovascular: Normal rate, regular rhythm, normal heart sounds and intact distal pulses.  Pulmonary/Chest: Effort normal and breath sounds normal.  Abdominal: Soft.  Neurological: She is alert and oriented to person, place, and time.  Skin: Skin is warm and dry. Capillary refill takes less than 2 seconds.  Psychiatric: She has a normal mood and affect. Her behavior is normal.    Assessment & Plan:   1. Acute non-recurrent maxillary sinusitis - amoxicillin-clavulanate (AUGMENTIN) 875-125 MG tablet; Take 1 tablet by mouth 2 (two) times daily.  Dispense: 14 tablet; Refill: 0 - sodium chloride (OCEAN) 0.65 % SOLN nasal spray; Place 1 spray into both nostrils as needed for congestion.  Dispense: 1 Bottle; Refill: 2  2. Sinus congestion - sodium chloride (OCEAN) 0.65 % SOLN nasal spray; Place 1 spray into both nostrils as needed for congestion.  Dispense: 1 Bottle; Refill: 2  3. Cough Moderate. She will continue Mucinex as needed.   4. Follow up She will follow up as needed.   Meds ordered this encounter  Medications  . amoxicillin-clavulanate (AUGMENTIN) 875-125 MG tablet    Sig: Take 1 tablet by mouth 2 (two) times daily.    Dispense:  14 tablet    Refill:  0  . sodium chloride (OCEAN) 0.65 % SOLN nasal spray    Sig: Place 1 spray into both nostrils as needed for congestion.    Dispense:  1 Bottle    Refill:  2    Raliegh Ip,  MSN, FNP-C [facility] [address] [place], [place] [address] [phone]

## 2018-05-11 NOTE — Patient Instructions (Signed)
Amoxicillin; Clavulanic Acid chewable tablets What is this medicine? AMOXICILLIN; CLAVULANIC ACID (a mox i SIL in; KLAV yoo lan ic AS id) is a penicillin antibiotic. It is used to treat certain kinds of bacterial infections. It It will not work for colds, flu, or other viral infections. This medicine may be used for other purposes; ask your health care provider or pharmacist if you have questions. COMMON BRAND NAME(S): Augmentin What should I tell my health care provider before I take this medicine? They need to know if you have any of these conditions: -bowel disease, like colitis -kidney disease -liver disease -mononucleosis -phenylketonuria -an unusual or allergic reaction to amoxicillin, penicillin, cephalosporin, other antibiotics, clavulanic acid, other medicines, foods, dyes, or preservatives -pregnant or trying to get pregnant -breast-feeding How should I use this medicine? Take this medicine by mouth. Chew it completely before swallowing. Follow the directions on the prescription label. Take this medicine at the start of a meal or snack. Take your medicine at regular intervals. Do not take your medicine more often than directed. Take all of your medicine as directed even if you think you are better. Do not skip doses or stop your medicine early. Talk to your pediatrician regarding the use of this medicine in children. While this drug may be prescribed for selected conditions, precautions do apply. Overdosage: If you think you have taken too much of this medicine contact a poison control center or emergency room at once. NOTE: This medicine is only for you. Do not share this medicine with others. What if I miss a dose? If you miss a dose, take it as soon as you can. If it is almost time for your next dose, take only that dose. Do not take double or extra doses. What may interact with this medicine? -allopurinol -anticoagulants -birth control pills -methotrexate -probenecid This  list may not describe all possible interactions. Give your health care provider a list of all the medicines, herbs, non-prescription drugs, or dietary supplements you use. Also tell them if you smoke, drink alcohol, or use illegal drugs. Some items may interact with your medicine. What should I watch for while using this medicine? Tell your doctor or health care professional if your symptoms do not improve. Do not treat diarrhea with over the counter products. Contact your doctor if you have diarrhea that lasts more than 2 days or if it is severe and watery. If you have diabetes, you may get a false-positive result for sugar in your urine. Check with your doctor or health care professional. Birth control pills may not work properly while you are taking this medicine. Talk to your doctor about using an extra method of birth control. What side effects may I notice from receiving this medicine? Side effects that you should report to your doctor or health care professional as soon as possible: -allergic reactions like skin rash, itching or hives, swelling of the face, lips, or tongue -breathing problems -dark urine -fever or chills, sore throat -redness, blistering, peeling or loosening of the skin, including inside the mouth -seizures -trouble passing urine or change in the amount of urine -unusual bleeding, bruising -unusually weak or tired -white patches or sores in the mouth or throat Side effects that usually do not require medical attention (report to your doctor or health care professional if they continue or are bothersome): -diarrhea -dizziness -headache -nausea, vomiting -stomach upset -vaginal or anal irritation This list may not describe all possible side effects. Call your doctor for medical advice   about side effects. You may report side effects to FDA at 1-800-FDA-1088. Where should I keep my medicine? Keep out of the reach of children. Store at room temperature below 25 degrees  C (77 degrees F). Keep container tightly closed. Throw away any unused medicine after the expiration date. NOTE: This sheet is a summary. It may not cover all possible information. If you have questions about this medicine, talk to your doctor, pharmacist, or health care provider.  2018 Elsevier/Gold Standard (2007-12-23 11:38:22)  

## 2018-05-12 ENCOUNTER — Ambulatory Visit: Payer: Self-pay | Admitting: Family Medicine

## 2018-07-08 ENCOUNTER — Ambulatory Visit (HOSPITAL_COMMUNITY)
Admission: EM | Admit: 2018-07-08 | Discharge: 2018-07-08 | Discharge status: Absent without leave | Payer: Medicaid Other

## 2018-07-09 ENCOUNTER — Encounter: Payer: Self-pay | Admitting: Family Medicine

## 2018-07-09 ENCOUNTER — Other Ambulatory Visit: Payer: Self-pay

## 2018-07-09 ENCOUNTER — Ambulatory Visit (INDEPENDENT_AMBULATORY_CARE_PROVIDER_SITE_OTHER): Payer: Self-pay | Admitting: Family Medicine

## 2018-07-09 VITALS — BP 105/67 | HR 86 | Temp 99.4°F | Ht 61.0 in | Wt 130.0 lb

## 2018-07-09 DIAGNOSIS — L232 Allergic contact dermatitis due to cosmetics: Secondary | ICD-10-CM

## 2018-07-09 MED ORDER — METHYLPREDNISOLONE 4 MG PO TBPK: ORAL_TABLET | ORAL | 0 refills | Status: DC

## 2018-07-09 MED ORDER — METHYLPREDNISOLONE SODIUM SUCC 125 MG IJ SOLR
125.0000 mg | Freq: Once | INTRAMUSCULAR | Status: AC
  Administered 2018-07-09: 125 mg via INTRAMUSCULAR

## 2018-07-09 MED ORDER — LEVOCETIRIZINE DIHYDROCHLORIDE 5 MG PO TABS: 5.0000 mg | ORAL_TABLET | Freq: Every evening | ORAL | 0 refills | Status: AC

## 2018-07-09 NOTE — Patient Instructions (Signed)
I am sending oral steroids and an antihistamine to the pharmacy. If the rash gets worse, please call back and let us know.  If you have difficulty with breathing, shortness of breath, throat itching or swelling, please go immediately to the emergency room.      Contact Dermatitis Dermatitis is redness, soreness, and swelling (inflammation) of the skin. Contact dermatitis is a reaction to certain substances that touch the skin. You either touched something that irritated your skin, or you have allergies to something you touched. Follow these instructions at home: Skin Care  Moisturize your skin as needed.  Apply cool compresses to the affected areas.  Try taking a bath with: ? Epsom salts. Follow the instructions on the package. You can get these at a pharmacy or grocery store. ? Baking soda. Pour a small amount into the bath as told by your doctor. ? Colloidal oatmeal. Follow the instructions on the package. You can get this at a pharmacy or grocery store.  Try applying baking soda paste to your skin. Stir water into baking soda until it looks like paste.  Do not scratch your skin.  Bathe less often.  Bathe in lukewarm water. Avoid using hot water. Medicines  Take or apply over-the-counter and prescription medicines only as told by your doctor.  If you were prescribed an antibiotic medicine, take or apply your antibiotic as told by your doctor. Do not stop taking the antibiotic even if your condition starts to get better. General instructions  Keep all follow-up visits as told by your doctor. This is important.  Avoid the substance that caused your reaction. If you do not know what caused it, keep a journal to try to track what caused it. Write down: ? What you eat. ? What cosmetic products you use. ? What you drink. ? What you wear in the affected area. This includes jewelry.  If you were given a bandage (dressing), take care of it as told by your doctor. This includes when  to change and remove it. Contact a doctor if:  You do not get better with treatment.  Your condition gets worse.  You have signs of infection such as: ? Swelling. ? Tenderness. ? Redness. ? Soreness. ? Warmth.  You have a fever.  You have new symptoms. Get help right away if:  You have a very bad headache.  You have neck pain.  Your neck is stiff.  You throw up (vomit).  You feel very sleepy.  You see red streaks coming from the affected area.  Your bone or joint underneath the affected area becomes painful after the skin has healed.  The affected area turns darker.  You have trouble breathing. This information is not intended to replace advice given to you by your health care provider. Make sure you discuss any questions you have with your health care provider. Document Released: 07/27/2009 Document Revised: 03/06/2016 Document Reviewed: 02/14/2015 Elsevier Interactive Patient Education  2018 [employer].

## 2018-07-09 NOTE — Progress Notes (Signed)
  [facility]   Progress Note: General Provider: Mike Gip, FNP  SUBJECTIVE:  Rash  This is a new problem. The current episode started in the past 7 days. The problem is unchanged. The rash is diffuse. The rash is characterized by redness and itchiness. Associated with: a new body oil.  Pertinent negatives include no facial edema, fever, joint pain, shortness of breath, sore throat or vomiting. Past treatments include antihistamine. The treatment provided mild relief. There is no history of allergies.   Kara Velez is a 27 y.o. female who  has a past medical history of Migraine headache and Nexplanon in place.. Patient presents today for Rash (started yesterday about 1 am while she was at work, took benadryl and she feels like it is getting worse)  Patient states that she started using a new body oil a few days ago. Noticed a rash to the entire body. Review of Systems  Constitutional: Negative for fever.  HENT: Negative for sore throat.   Respiratory: Negative for shortness of breath.   Gastrointestinal: Negative for vomiting.  Musculoskeletal: Negative for joint pain.  Skin: Positive for rash.  All other systems reviewed and are negative.    OBJECTIVE: BP 105/67   Pulse 86   Temp 99.4 F (37.4 C) (Oral)   Ht 5\' 1"  (1.549 m)   Wt 130 lb (59 kg)   SpO2 100%   BMI 24.56 kg/m   Physical Exam  Constitutional: She is oriented to person, place, and time. She appears well-developed and well-nourished. No distress.  HENT:  Head: Normocephalic and atraumatic.  Mouth/Throat: Uvula is midline and oropharynx is clear and moist. No oropharyngeal exudate.  Eyes: Pupils are equal, round, and reactive to light. Conjunctivae and EOM are normal.  Neck: Normal range of motion.  Cardiovascular: Normal rate, regular rhythm, normal heart sounds and intact distal pulses.  Pulmonary/Chest: Effort normal and breath sounds normal. No respiratory  distress.  Abdominal: Soft. Bowel sounds are normal. She exhibits no distension.  Musculoskeletal: Normal range of motion.  Neurological: She is alert and oriented to person, place, and time.  Skin: Skin is warm and dry. Rash noted. Rash is papular (over upper and lower extremities trunk back chest and lower porttion of the face. ).  Psychiatric: She has a normal mood and affect. Her behavior is normal. Thought content normal.  Nursing note and vitals reviewed.    ASSESSMENT/PLAN:   1. Allergic contact dermatitis due to cosmetics D/c body oil. Increase fluids. Inj given in office.  - methylPREDNISolone sodium succinate (SOLU-MEDROL) 125 mg/2 mL injection 125 mg - methylPREDNISolone (MEDROL DOSEPAK) 4 MG TBPK tablet; Take as directed on pack  Dispense: 21 tablet; Refill: 0 - levocetirizine (XYZAL) 5 MG tablet; Take 1 tablet (5 mg total) by mouth every evening.  Dispense: 30 tablet; Refill: 0        The patient was given clear instructions to go to ER or return to medical center if symptoms do not improve, worsen or new problems develop. The patient verbalized understanding and agreed with plan of care.   [name]. Riley Lam, FNP-BC [facility] [address] [place], [place] [address] [phone]     This note has been created with Dragon speech recognition software and smart phrase technology. Any transcriptional errors are unintentional.

## 2019-03-17 ENCOUNTER — Ambulatory Visit (INDEPENDENT_AMBULATORY_CARE_PROVIDER_SITE_OTHER): Payer: No Typology Code available for payment source | Admitting: Family Medicine

## 2019-03-17 ENCOUNTER — Encounter: Payer: Self-pay | Admitting: Family Medicine

## 2019-03-17 ENCOUNTER — Other Ambulatory Visit: Payer: Self-pay

## 2019-03-17 VITALS — BP 123/67 | HR 96 | Temp 98.7°F | Resp 14 | Ht 61.0 in | Wt 141.0 lb

## 2019-03-17 DIAGNOSIS — G43009 Migraine without aura, not intractable, without status migrainosus: Secondary | ICD-10-CM | POA: Diagnosis not present

## 2019-03-17 MED ORDER — KETOROLAC TROMETHAMINE 60 MG/2ML IM SOLN
60.0000 mg | Freq: Once | INTRAMUSCULAR | Status: AC
  Administered 2019-03-17: 60 mg via INTRAMUSCULAR

## 2019-03-17 MED ORDER — BUTALBITAL-APAP-CAFFEINE 50-325-40 MG PO TABS: 1.0000 | ORAL_TABLET | Freq: Four times a day (QID) | ORAL | 1 refills | Status: AC | PRN

## 2019-03-17 MED ORDER — FLUTICASONE PROPIONATE 50 MCG/ACT NA SUSP: 2.0000 | Freq: Every day | NASAL | 6 refills | Status: AC

## 2019-03-17 NOTE — Patient Instructions (Signed)

## 2019-03-17 NOTE — Progress Notes (Signed)
[facility]   Progress Note: Sick Visit Provider: Mike GipAndre Anaiza Behrens, FNP  SUBJECTIVE:   Kara Velez is a 28 y.o. female who  has a past medical history of Migraine headache and Nexplanon in place.. Patient presents today for Headache (x 4 days. No relief with otc tylenol ) and Eye Twitching (right eye )  Headache   This is a chronic problem. The current episode started in the past 7 days. The problem occurs daily. The problem has been waxing and waning. The pain is located in the bilateral and temporal region. The pain radiates to the left neck and right neck. The pain quality is similar to prior headaches. The quality of the pain is described as aching, dull and throbbing. The pain is at a severity of 2/10. Associated symptoms include nausea. The symptoms are aggravated by activity, bright light and weather changes. She has tried acetaminophen for the symptoms. The treatment provided no relief. Her past medical history is significant for migraine headaches and migraines in the family.    Review of Systems  Gastrointestinal: Positive for nausea.  Neurological: Positive for headaches.  All other systems reviewed and are negative.    OBJECTIVE: BP 123/67 (BP Location: Left Arm, Patient Position: Sitting, Cuff Size: Normal)   Pulse 96   Temp 98.7 F (37.1 C) (Oral)   Resp 14   Ht 5\' 1"  (1.549 m)   Wt 141 lb (64 kg)   LMP 03/11/2019   SpO2 100%   BMI 26.64 kg/m   Wt Readings from Last 3 Encounters:  03/17/19 141 lb (64 kg)  07/09/18 130 lb (59 kg)  05/11/18 131 lb 9.6 oz (59.7 kg)     Physical Exam Vitals signs and nursing note reviewed.  Constitutional:      General: She is not in acute distress.    Appearance: She is well-developed.  HENT:     Head: Normocephalic and atraumatic.     Right Ear: Tympanic membrane normal.     Left Ear: Tympanic membrane normal.     Nose: No congestion or rhinorrhea.     Right Turbinates:  Enlarged and pale.     Left Turbinates: Enlarged and pale.     Right Sinus: No maxillary sinus tenderness or frontal sinus tenderness.     Left Sinus: No maxillary sinus tenderness or frontal sinus tenderness.     Mouth/Throat:     Mouth: Mucous membranes are moist.     Pharynx: Oropharynx is clear.  Eyes:     Extraocular Movements: Extraocular movements intact.     Conjunctiva/sclera: Conjunctivae normal.     Pupils: Pupils are equal, round, and reactive to light.  Neck:     Musculoskeletal: Normal range of motion. No muscular tenderness.  Cardiovascular:     Rate and Rhythm: Normal rate.  Pulmonary:     Effort: Pulmonary effort is normal. No respiratory distress.  Lymphadenopathy:     Cervical: No cervical adenopathy.  Skin:    General: Skin is warm and dry.  Neurological:     Mental Status: She is alert and oriented to person, place, and time.  Psychiatric:        Mood and Affect: Mood normal.        Behavior: Behavior normal.        Thought Content: Thought content normal.        Judgment: Judgment normal.     ASSESSMENT/PLAN:   1. 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  - diphenhydrAMINE (BENADRYL) 25 MG tablet; Take 25 mg by mouth every 6 (six) hours as needed. - butalbital-acetaminophen-caffeine (FIORICET) 50-325-40 MG tablet; Take 1 tablet by mouth every 6 (six) hours as needed for headache.  Dispense: 45 tablet; Refill: 1 - fluticasone (FLONASE) 50 MCG/ACT nasal spray; Place 2 sprays into both nostrils daily.  Dispense: 16 g; Refill: 6 - ketorolac (TORADOL) injection 60 mg        The patient was given clear instructions to go to ER or return to medical center if symptoms do not improve, worsen or new problems develop. The patient verbalized understanding and agreed with plan of care.   Ms. Kara Velez. Kara Lam, FNP-BC [facility] [address] [place], [place] [address] [phone]     This note has been created with Dragon speech recognition software and smart phrase technology. Any transcriptional errors are unintentional.

## 2019-03-25 ENCOUNTER — Telehealth: Payer: Self-pay | Admitting: Family Medicine

## 2019-03-25 NOTE — Telephone Encounter (Signed)
Kara Velez, Can medication be changed?

## 2019-03-28 MED ORDER — SUMATRIPTAN SUCCINATE 50 MG PO TABS
50.0000 mg | ORAL_TABLET | ORAL | 2 refills | Status: AC | PRN
Start: 2019-03-28 — End: ?

## 2019-03-28 NOTE — Addendum Note (Signed)
Addended by: Genelle Bal on: 03/28/2019 03:13 PM   Modules accepted: Orders

## 2019-03-28 NOTE — Telephone Encounter (Signed)
Called, no answer and goes straight to voicemail.

## 2019-03-29 NOTE — Telephone Encounter (Signed)
Called and spoke with patient, advised that rx for imitrex 50mg  had been sent into pharmacy and to take as needed for migraines and not to exceed 4 tablets in a 24 hour period. Patient verbalized understanding. Thanks!

## 2019-04-25 ENCOUNTER — Encounter (HOSPITAL_COMMUNITY): Payer: Self-pay

## 2019-04-25 ENCOUNTER — Ambulatory Visit (HOSPITAL_COMMUNITY)
Admission: EM | Admit: 2019-04-25 | Discharge: 2019-04-25 | Disposition: A | Discharge status: Home / Self Care | Payer: No Typology Code available for payment source

## 2019-04-25 ENCOUNTER — Other Ambulatory Visit: Payer: Self-pay

## 2019-04-25 DIAGNOSIS — R0981 Nasal congestion: Secondary | ICD-10-CM | POA: Diagnosis not present

## 2019-04-25 DIAGNOSIS — Z20828 Contact with and (suspected) exposure to other viral communicable diseases: Secondary | ICD-10-CM

## 2019-04-25 DIAGNOSIS — Z20822 Contact with and (suspected) exposure to covid-19: Secondary | ICD-10-CM

## 2019-04-25 NOTE — Discharge Instructions (Signed)
Take over-the-counter ibuprofen and Mucinex as needed for your nasal congestion.  Your COVID test has been ordered.  You should receive a phone call to schedule this.  You should self quarantine while you are waiting for your test results and they are negative.

## 2019-04-25 NOTE — ED Triage Notes (Signed)
Pt presents with nasal congestion and sinus pressure X 2 days.

## 2019-04-25 NOTE — ED Provider Notes (Signed)
[facility]    CSN: [id number] Arrival date & time: 04/25/19  1632     History   Chief Complaint Chief Complaint  Patient presents with  . Nasal Congestion    HPI Kara Velez is a 28 y.o. female.   Patient presents with a 2-3 day history of nasal congestion, sinus pressure, and loss of her sense of smell.  She has been taking over-the-counter cold remedy without relief.  She denies fever, chills, cough, shortness of breath, nausea, vomiting, diarrhea.  LMP: 04/15/2019.  She requests COVID testing.  The history is provided by the patient.    Past Medical History:  Diagnosis Date  . Migraine headache   . Nexplanon in place     Patient Active Problem List   Diagnosis Date Noted  . Migraine without aura and without status migrainosus, not intractable 12/12/2016  . Chronic bilateral low back pain without sciatica 12/12/2016  . Nexplanon in place 04/11/2016  . Frequent headaches 04/11/2016    Past Surgical History:  Procedure Laterality Date  . extraction of wisdom teeth    . WISDOM TOOTH EXTRACTION     three removed    OB History   No obstetric history on file.      Home Medications    Prior to Admission medications   Medication Sig Start Date End Date Taking? Authorizing Provider  butalbital-acetaminophen-caffeine (FIORICET) 50-325-40 MG tablet Take 1 tablet by mouth every 6 (six) hours as needed for headache. 03/17/19   Mike Gipouglas, Andre, FNP  diphenhydrAMINE (BENADRYL) 25 MG tablet Take 25 mg by mouth every 6 (six) hours as needed.    [provider]  fluticasone (FLONASE) 50 MCG/ACT nasal spray Place 2 sprays into both nostrils daily. 03/17/19   Mike Gipouglas, Andre, FNP  levocetirizine (XYZAL) 5 MG tablet Take 1 tablet (5 mg total) by mouth every evening. Patient not taking: Reported on 03/17/2019 07/09/18   Mike Gipouglas, Andre, FNP  SUMAtriptan (IMITREX) 50 MG tablet Take 1 tablet (50 mg total) by mouth every 2 (two) hours as needed for migraine. May repeat  in 2 hours if headache persists or recurs. 03/28/19   Mike Gipouglas, Andre, FNP  Burr MedicoXULANE 150-35 MCG/24HR transdermal patch 150 patches once a week. 04/18/18   [provider]    Family History Family History  Problem Relation Age of Onset  . Hypertension Other   . Cancer Other   . Diabetes Other     Social History Social History   Tobacco Use  . Smoking status: Never Smoker  . Smokeless tobacco: Never Used  Substance Use Topics  . Alcohol use: Yes    Comment: occ   . Drug use: No    Types: Marijuana    Comment: last used about 2 weeks ago      Allergies   Patient has no known allergies.   Review of Systems Review of Systems  Constitutional: Negative for chills and fever.  HENT: Positive for congestion and sinus pressure. Negative for ear pain, sore throat and trouble swallowing.   Eyes: Negative for pain and visual disturbance.  Respiratory: Negative for cough and shortness of breath.   Cardiovascular: Negative for chest pain and palpitations.  Gastrointestinal: Negative for abdominal pain and vomiting.  Genitourinary: Negative for dysuria and hematuria.  Musculoskeletal: Negative for arthralgias and back pain.  Skin: Negative for color change and rash.  Neurological: Negative for seizures and syncope.  All other systems reviewed and are negative.    Physical Exam Triage Vital Signs  ED Triage Vitals  Enc Vitals Group     BP 04/25/19 1717 110/77     Pulse Rate 04/25/19 1717 88     Resp 04/25/19 1717 17     Temp 04/25/19 1717 98.1 F (36.7 C)     Temp Source 04/25/19 1717 Oral     SpO2 04/25/19 1717 97 %     Weight --      Height --      Head Circumference --      Peak Flow --      Pain Score 04/25/19 1718 2     Pain Loc --      Pain Edu? --      Excl. in [place]? --    No data found.  Updated Vital Signs BP 110/77 (BP Location: Left Arm)   Pulse 88   Temp 98.1 F (36.7 C) (Oral)   Resp 17   LMP 04/15/2019 Comment: Birth Control Patch  SpO2 97%    Visual Acuity Right Eye Distance:   Left Eye Distance:   Bilateral Distance:    Right Eye Near:   Left Eye Near:    Bilateral Near:     Physical Exam Vitals signs and nursing note reviewed.  Constitutional:      General: She is not in acute distress.    Appearance: She is well-developed.  HENT:     Head: Normocephalic and atraumatic.     Right Ear: Tympanic membrane normal.     Left Ear: Tympanic membrane normal.     Nose: Congestion present.     Mouth/Throat:     Mouth: Mucous membranes are moist.     Pharynx: No oropharyngeal exudate or posterior oropharyngeal erythema.  Eyes:     Conjunctiva/sclera: Conjunctivae normal.  Neck:     Musculoskeletal: Neck supple.  Cardiovascular:     Rate and Rhythm: Normal rate and regular rhythm.     Heart sounds: Normal heart sounds.  Pulmonary:     Effort: Pulmonary effort is normal. No respiratory distress.     Breath sounds: Normal breath sounds.  Abdominal:     Palpations: Abdomen is soft.     Tenderness: There is no abdominal tenderness. There is no right CVA tenderness, left CVA tenderness, guarding or rebound.  Skin:    General: Skin is warm and dry.  Neurological:     General: No focal deficit present.     Mental Status: She is alert and oriented to person, place, and time.      UC Treatments / Results  Labs (all labs ordered are listed, but only abnormal results are displayed) Labs Reviewed - No data to display  EKG   Radiology No results found.  Procedures Procedures (including critical care time)  Medications Ordered in UC Medications - No data to display  Initial Impression / Assessment and Plan / UC Course  I have reviewed the triage vital signs and the nursing notes.  Pertinent labs & imaging results that were available during my care of the patient were reviewed by me and considered in my medical decision making (see chart for details).   Suspect COVID.  Nasal congestion.  Discussed with patient  that her COVID test has been ordered and she will receive a phone call to get this scheduled.  Discussed that she should self quarantine until her test results are back and are negative.  Discussed that she should take over-the-counter ibuprofen and Mucinex as needed for her nasal congestion.  Final Clinical Impressions(s) / UC Diagnoses   Final diagnoses:  Nasal congestion  Suspected Covid-19 Virus Infection     Discharge Instructions     Take over-the-counter ibuprofen and Mucinex as needed for your nasal congestion.  Your COVID test has been ordered.  You should receive a phone call to schedule this.  You should self quarantine while you are waiting for your test results and they are negative.        ED Prescriptions    None     Controlled Substance Prescriptions  Controlled Substance Registry consulted? Not Applicable   Mickie Bailate, Adamariz Gillott H, NP 04/25/19 1746

## 2019-04-26 ENCOUNTER — Telehealth: Payer: Self-pay | Admitting: *Deleted

## 2019-04-26 NOTE — Telephone Encounter (Signed)
-----   Message from Sharion Balloon, NP sent at 04/25/2019  5:38 PM EDT ----- Regarding: need COVID test

## 2019-04-26 NOTE — Telephone Encounter (Signed)
Pt scheduled for covid testing 04/27/19 @ 9:00. Instructions given and order previously placed

## 2019-04-27 ENCOUNTER — Other Ambulatory Visit: Payer: Self-pay

## 2019-04-27 DIAGNOSIS — Z20822 Contact with and (suspected) exposure to covid-19: Secondary | ICD-10-CM

## 2019-04-29 ENCOUNTER — Ambulatory Visit: Payer: No Typology Code available for payment source | Admitting: Family Medicine

## 2019-05-01 LAB — NOVEL CORONAVIRUS, NAA: SARS-CoV-2, NAA: DETECTED — AB

## 2019-05-02 ENCOUNTER — Telehealth: Payer: Self-pay

## 2019-05-02 ENCOUNTER — Other Ambulatory Visit: Payer: Self-pay

## 2019-05-02 DIAGNOSIS — Z20822 Contact with and (suspected) exposure to covid-19: Secondary | ICD-10-CM

## 2019-05-02 NOTE — Addendum Note (Signed)
Addended by: Shetara Launer M on: 05/02/2019 09:09 PM   Modules accepted: Orders  

## 2019-05-02 NOTE — Telephone Encounter (Signed)
Received a call from Corky Sox with Patient access center. Patient tested positive and Judson Roch asked that we report to Health Department. I have called health department and was giving information via automated recording to send covid positive test to "Tammy" at fax number [phone]. I have faxed this over and received a conformation at 10:52am. Thanks!

## 2019-05-04 LAB — NOVEL CORONAVIRUS, NAA: SARS-CoV-2, NAA: NOT DETECTED

## 2019-06-22 ENCOUNTER — Encounter (HOSPITAL_COMMUNITY): Payer: Self-pay

## 2019-06-22 ENCOUNTER — Encounter (HOSPITAL_COMMUNITY): Payer: Self-pay | Admitting: *Deleted

## 2019-12-06 ENCOUNTER — Ambulatory Visit (INDEPENDENT_AMBULATORY_CARE_PROVIDER_SITE_OTHER)
Admission: RE | Admit: 2019-12-06 | Discharge: 2019-12-06 | Disposition: A | Discharge status: Home / Self Care | Payer: Self-pay | Source: Ambulatory Visit

## 2019-12-06 ENCOUNTER — Other Ambulatory Visit: Payer: Self-pay

## 2019-12-06 DIAGNOSIS — T148XXA Other injury of unspecified body region, initial encounter: Secondary | ICD-10-CM

## 2019-12-06 NOTE — ED Provider Notes (Signed)
Virtual Visit via Video Note:  Kara Velez  initiated request for Telemedicine visit with [facility] Urgent Care team. I connected with Kara Velez  on 12/06/2019 at 11:07 AM  for a synchronized telemedicine visit using a video enabled HIPPA compliant telemedicine application. I verified that I am speaking with Kara Velez  using two identifiers. Kara Bail, Kara Velez  was physically located in a [facility] Urgent care site and Kara Velez was located at a different location.   The limitations of evaluation and management by telemedicine as well as the availability of in-person appointments were discussed. Patient was informed that she  may incur a bill ( including co-pay) for this virtual visit encounter. Kara Velez  expressed understanding and gave verbal consent to proceed with virtual visit.     History of Present Illness:Kara Velez  is a 29 y.o. female presents for evaluation of "pulled muscle" at work.  She is concerned that she may have "done more damage" than just a pulled muscle.  Her work told her she had to be seen by a healthcare provider in order to receive accommodations at work.  She denies other symptoms.     No Known Allergies   Past Medical History:  Diagnosis Date  . Migraine headache   . Nexplanon in place      Social History   Tobacco Use  . Smoking status: Never Smoker  . Smokeless tobacco: Never Used  Substance Use Topics  . Alcohol use: Yes    Comment: occ   . Drug use: No    Types: Marijuana    Comment: last used about 2 weeks ago         Observations/Objective: Physical Exam  VITALS: Patient denies fever. GENERAL: Alert, appears well and in no acute distress. HEENT: Atraumatic. NECK: Normal movements of the head and neck. CARDIOPULMONARY: No increased WOB. Speaking in clear sentences. I:E ratio WNL.  MS: Moves all visible extremities without noticeable abnormality. PSYCH: Pleasant and cooperative, well-groomed. Speech normal  rate and rhythm. Affect is appropriate. Insight and judgement are appropriate. Attention is focused, linear, and appropriate.  NEURO: CN grossly intact. Oriented as arrived to appointment on time with no prompting. Moves both UE equally.  SKIN: No obvious lesions, wounds, erythema, or cyanosis noted on face or hands.   Assessment and Plan:    ICD-10-CM   1. Muscle strain  T14.8XXA        Follow Up Instructions: Based on the patient's statement of her symptoms, instructed her to come to the urgent care to be seen in person or to follow-up with her primary care provider for evaluation of her symptoms.  Patient agrees to plan of care.      I discussed the assessment and treatment plan with the patient. The patient was provided an opportunity to ask questions and all were answered. The patient agreed with the plan and demonstrated an understanding of the instructions.   The patient was advised to call back or seek an in-person evaluation if the symptoms worsen or if the condition fails to improve as anticipated.      Kara Bail, Kara Velez  12/06/2019 11:07 AM         Kara Bail, Kara Velez 12/06/19 [phone]

## 2019-12-06 NOTE — Discharge Instructions (Addendum)
Come to the urgent care to be seen in person or follow-up with your primary care provider for evaluation of your symptoms.

## 2020-09-03 ENCOUNTER — Emergency Department (INDEPENDENT_AMBULATORY_CARE_PROVIDER_SITE_OTHER)
Admission: RE | Admit: 2020-09-03 | Discharge: 2020-09-03 | Disposition: A | Discharge status: Home / Self Care | Payer: BC Managed Care – PPO | Source: Ambulatory Visit

## 2020-09-03 ENCOUNTER — Other Ambulatory Visit: Payer: Self-pay

## 2020-09-03 VITALS — BP 98/77 | HR 85 | Temp 98.4°F | Resp 16

## 2020-09-03 DIAGNOSIS — J039 Acute tonsillitis, unspecified: Secondary | ICD-10-CM

## 2020-09-03 DIAGNOSIS — H9201 Otalgia, right ear: Secondary | ICD-10-CM | POA: Diagnosis not present

## 2020-09-03 MED ORDER — CEPHALEXIN 500 MG PO CAPS: 500.0000 mg | ORAL_CAPSULE | Freq: Three times a day (TID) | ORAL | 0 refills | Status: AC

## 2020-09-03 MED ORDER — PREDNISONE 10 MG PO TABS: 20.0000 mg | ORAL_TABLET | Freq: Every day | ORAL | 0 refills | Status: AC

## 2020-09-03 NOTE — ED Triage Notes (Signed)
Patient presents to Urgent Care with complaints of sore throat and right ear pain since a few days ago. Patient reports she has been putting Vicks on a cotton ball and putting it in her ear, also taking sudafed, no improvement.  Pt feels like the pain is coming from her ear and making her throat hurt.

## 2020-09-03 NOTE — Discharge Instructions (Addendum)
COVID test will result within 2-3 days. Your results will update on Mychart.

## 2020-09-03 NOTE — ED Provider Notes (Signed)
[facility]    CSN: [id number] Arrival date & time: 09/03/20  1306      History   Chief Complaint Chief Complaint  Patient presents with  . Appointment    1:00  . Sore Throat  . Otalgia    HPI Kara Velez is a 29 y.o. female.   HPI  Patient presents today with sore throat and ear pain x3 days.  She reports her throat has gradually worsened over the course of the last few days to the point she feels pressure in her inner ear.  The pain in her throat and in her ear all localized to the right side.  She denies any known sick contacts.  She is afebrile.  Denies any other URI symptoms.  Past Medical History:  Diagnosis Date  . Migraine headache   . Nexplanon in place     Patient Active Problem List   Diagnosis Date Noted  . Migraine without aura and without status migrainosus, not intractable 12/12/2016  . Chronic bilateral low back pain without sciatica 12/12/2016  . Nexplanon in place 04/11/2016  . Frequent headaches 04/11/2016    Past Surgical History:  Procedure Laterality Date  . extraction of wisdom teeth    . WISDOM TOOTH EXTRACTION     three removed    OB History   No obstetric history on file.      Home Medications    Prior to Admission medications   Medication Sig Start Date End Date Taking? Authorizing Provider  butalbital-acetaminophen-caffeine (FIORICET) 50-325-40 MG tablet Take 1 tablet by mouth every 6 (six) hours as needed for headache. 03/17/19   Mike Gip, FNP  diphenhydrAMINE (BENADRYL) 25 MG tablet Take 25 mg by mouth every 6 (six) hours as needed.    [provider]  fluticasone (FLONASE) 50 MCG/ACT nasal spray Place 2 sprays into both nostrils daily. 03/17/19   Mike Gip, FNP  levocetirizine (XYZAL) 5 MG tablet Take 1 tablet (5 mg total) by mouth every evening. Patient not taking: Reported on 03/17/2019 07/09/18   Mike Gip, FNP  SUMAtriptan (IMITREX) 50 MG tablet Take 1 tablet (50 mg total) by mouth every  2 (two) hours as needed for migraine. May repeat in 2 hours if headache persists or recurs. 03/28/19   Mike Gip, FNP  Burr Medico 150-35 MCG/24HR transdermal patch 150 patches once a week. 04/18/18   [provider]    Family History Family History  Problem Relation Age of Onset  . Hypertension Other   . Cancer Other   . Diabetes Other   . Hypertension Mother   . Healthy Father     Social History Social History   Tobacco Use  . Smoking status: Never Smoker  . Smokeless tobacco: Never Used  Vaping Use  . Vaping Use: Never used  Substance Use Topics  . Alcohol use: Yes    Comment: occ   . Drug use: No    Types: Marijuana    Comment: last used about 2 weeks ago      Allergies   Patient has no known allergies.   Review of Systems Review of Systems Pertinent negatives listed in HPI Physical Exam Triage Vital Signs ED Triage Vitals  Enc Vitals Group     BP 09/03/20 1332 98/77     Pulse Rate 09/03/20 1332 85     Resp 09/03/20 1332 16     Temp 09/03/20 1332 98.4 F (36.9 C)     Temp Source 09/03/20 1332 Oral  SpO2 09/03/20 1332 99 %     Weight --      Height --      Head Circumference --      Peak Flow --      Pain Score 09/03/20 1330 4     Pain Loc --      Pain Edu? --      Excl. in GC? --    No data found.  Updated Vital Signs BP 98/77 (BP Location: Left Arm)   Pulse 85   Temp 98.4 F (36.9 C) (Oral)   Resp 16   SpO2 99%   Visual Acuity Right Eye Distance:   Left Eye Distance:   Bilateral Distance:    Right Eye Near:   Left Eye Near:    Bilateral Near:     Physical Exam Constitutional:      Appearance: She is well-developed.  HENT:     Right Ear: Tympanic membrane normal.     Left Ear: Tympanic membrane normal.  Cardiovascular:     Rate and Rhythm: Normal rate and regular rhythm.  Pulmonary:     Effort: Pulmonary effort is normal.  Skin:    General: Skin is warm and dry.     Capillary Refill: Capillary refill takes less than  2 seconds.  Neurological:     General: No focal deficit present.     Mental Status: She is alert.      UC Treatments / Results  Labs (all labs ordered are listed, but only abnormal results are displayed) Labs Reviewed  COVID-19, FLU A+B AND RSV   Narrative:    Test(s) 140142-Influenza A, NAA; 140143-Influenza B, NAA; 140144- RSV, NAA was developed and its performance characteristics determined by [facility]. It has not been cleared or approved by the Food and Drug Administration. Performed at:  [address], [place], [place]  [id number] Lab Director: Jolene Schimke MD, Phone:  [phone]    EKG   Radiology No results found.  Procedures Procedures (including critical care time)  Medications Ordered in UC Medications - No data to display  Initial Impression / Assessment and Plan / UC Course  I have reviewed the triage vital signs and the nursing notes.  Pertinent labs & imaging results that were available during my care of the patient were reviewed by me and considered in my medical decision making (see chart for details).    Covid test pending.  Given appearance of tonsils and ear will treat empirically to cover for tonsillitis and for probable acute otitis media involving the right ear.Given swelling tonsil and swelling in right ear will cover with prednisone.  If symptoms worsen or you develop inability to swallow go immediately to the emergency department otherwise follow-up with primary care provider. Final Clinical Impressions(s) / UC Diagnoses   Final diagnoses:  Right ear pain  Acute tonsillitis, unspecified etiology     Discharge Instructions     COVID test will result within 2-3 days. Your results will update on Mychart.    ED Prescriptions    Medication Sig Dispense Auth. Provider   predniSONE (DELTASONE) 10 MG tablet Take 2 tablets (20 mg total) by mouth daily for 3 days. 6 tablet Bing Neighbors, FNP   cephALEXin (KEFLEX)  500 MG capsule Take 1 capsule (500 mg total) by mouth 3 (three) times daily for 10 days. 30 capsule Bing Neighbors, FNP     PDMP not reviewed this encounter.   Bing Neighbors, FNP 09/09/20  1032  

## 2020-09-04 ENCOUNTER — Ambulatory Visit (HOSPITAL_COMMUNITY): Payer: Self-pay

## 2020-09-05 LAB — COVID-19, FLU A+B AND RSV
Influenza A, NAA: NOT DETECTED
Influenza B, NAA: NOT DETECTED
RSV, NAA: NOT DETECTED
SARS-CoV-2, NAA: NOT DETECTED

## 2021-07-15 ENCOUNTER — Encounter (HOSPITAL_BASED_OUTPATIENT_CLINIC_OR_DEPARTMENT_OTHER): Payer: Self-pay

## 2021-07-15 ENCOUNTER — Emergency Department (HOSPITAL_BASED_OUTPATIENT_CLINIC_OR_DEPARTMENT_OTHER)
Admission: EM | Admit: 2021-07-15 | Discharge: 2021-07-15 | Disposition: A | Discharge status: Home / Self Care | Payer: 59 | Attending: Emergency Medicine | Admitting: Emergency Medicine

## 2021-07-15 ENCOUNTER — Other Ambulatory Visit: Payer: Self-pay

## 2021-07-15 ENCOUNTER — Emergency Department (HOSPITAL_BASED_OUTPATIENT_CLINIC_OR_DEPARTMENT_OTHER): Payer: 59

## 2021-07-15 ENCOUNTER — Emergency Department (HOSPITAL_COMMUNITY): Admission: EM | Admit: 2021-07-15 | Discharge: 2021-07-15 | Discharge status: Against Medical Advice | Payer: 59

## 2021-07-15 DIAGNOSIS — R1012 Left upper quadrant pain: Secondary | ICD-10-CM | POA: Diagnosis not present

## 2021-07-15 DIAGNOSIS — R1013 Epigastric pain: Secondary | ICD-10-CM | POA: Diagnosis present

## 2021-07-15 DIAGNOSIS — R1010 Upper abdominal pain, unspecified: Secondary | ICD-10-CM

## 2021-07-15 LAB — COMPREHENSIVE METABOLIC PANEL
ALT: 9 U/L (ref 0–44)
AST: 11 U/L — ABNORMAL LOW (ref 15–41)
Albumin: 4.2 g/dL (ref 3.5–5.0)
Alkaline Phosphatase: 56 U/L (ref 38–126)
Anion gap: 9 (ref 5–15)
BUN: 10 mg/dL (ref 6–20)
CO2: 25 mmol/L (ref 22–32)
Calcium: 9.7 mg/dL (ref 8.9–10.3)
Chloride: 104 mmol/L (ref 98–111)
Creatinine, Ser: 0.58 mg/dL (ref 0.44–1.00)
GFR, Estimated: >60 mL/min (ref >60)
Glucose, Bld: 83 mg/dL (ref 70–99)
Potassium: 3.8 mmol/L (ref 3.5–5.1)
Sodium: 138 mmol/L (ref 135–145)
Total Bilirubin: 0.6 mg/dL (ref 0.3–1.2)
Total Protein: 7.8 g/dL (ref 6.5–8.1)

## 2021-07-15 LAB — URINALYSIS, ROUTINE W REFLEX MICROSCOPIC
Bilirubin Urine: NEGATIVE
Glucose, UA: NEGATIVE mg/dL
Hgb urine dipstick: NEGATIVE
Ketones, ur: NEGATIVE mg/dL
Leukocytes,Ua: NEGATIVE
Nitrite: NEGATIVE
Specific Gravity, Urine: 1.027 (ref 1.005–1.030)
pH: 6 (ref 5.0–8.0)

## 2021-07-15 LAB — CBC
HCT: 39.4 % (ref 36.0–46.0)
Hemoglobin: 12.5 g/dL (ref 12.0–15.0)
MCH: 25.8 pg — ABNORMAL LOW (ref 26.0–34.0)
MCHC: 31.7 g/dL (ref 30.0–36.0)
MCV: 81.2 fL (ref 80.0–100.0)
Platelets: 293 10*3/uL (ref 150–400)
RBC: 4.85 MIL/uL (ref 3.87–5.11)
RDW: 12.9 % (ref 11.5–15.5)
WBC: 12.4 10*3/uL — ABNORMAL HIGH (ref 4.0–10.5)
nRBC: 0 % (ref 0.0–0.2)

## 2021-07-15 LAB — PREGNANCY, URINE: Preg Test, Ur: NEGATIVE

## 2021-07-15 LAB — LIPASE, BLOOD: Lipase: 21 U/L (ref 11–51)

## 2021-07-15 MED ORDER — NAPROXEN 500 MG PO TABS
500.0000 mg | ORAL_TABLET | Freq: Two times a day (BID) | ORAL | 0 refills | Status: AC
Start: 2021-07-15 — End: ?

## 2021-07-15 MED ORDER — LIDOCAINE VISCOUS HCL 2 % MT SOLN
15.0000 mL | Freq: Once | OROMUCOSAL | Status: AC
  Administered 2021-07-15: 15 mL via ORAL
  Filled 2021-07-15: qty 15

## 2021-07-15 MED ORDER — OXYCODONE-ACETAMINOPHEN 5-325 MG PO TABS
1.0000 | ORAL_TABLET | Freq: Once | ORAL | Status: AC
  Administered 2021-07-15: 1 via ORAL
  Filled 2021-07-15: qty 1

## 2021-07-15 MED ORDER — ALUM & MAG HYDROXIDE-SIMETH 200-200-20 MG/5ML PO SUSP
30.0000 mL | Freq: Once | ORAL | Status: AC
  Administered 2021-07-15: 30 mL via ORAL
  Filled 2021-07-15: qty 30

## 2021-07-15 NOTE — Discharge Instructions (Addendum)
Take the anti-inflammatory medication as prescribed. Return to the ER if you start having chest pain, shortness of breath, near fainting or fainting. Also return to the ER if you start having severe nausea, vomiting, fevers or worsening abdominal pain.

## 2021-07-15 NOTE — ED Provider Notes (Signed)
[facility] EMERGENCY DEPT Provider Note   CSN: [id number] Arrival date & time: 07/15/21  1111     History Chief Complaint  Patient presents with   Abdominal Pain    Kara Velez is a 30 y.o. female.  HPI     30 year old female comes in with chief complaint of abdominal pain. Patient reports that she has been having abdominal pain for the last 3 days.  The pain is located in the upper abdominal quadrants, mostly in the epigastric and left side.  She denies any radiation of the pain.  Pain is worse with certain activities and movements.  Patient does admit to drinking on Friday night.  She denies any history of GERD.  There is no personal history of any fibroid, cyst and patient denies any abdominal surgical history.  No UTI-like symptoms, vaginal discharge or bleeding.  Patient also denies any chest pain, shortness of breath but does indicate that the abdominal pain is worse with deep inspiration.  No history of PE, DVT.  Past Medical History:  Diagnosis Date   Migraine headache    Nexplanon in place     Patient Active Problem List   Diagnosis Date Noted   Migraine without aura and without status migrainosus, not intractable 12/12/2016   Chronic bilateral low back pain without sciatica 12/12/2016   Nexplanon in place 04/11/2016   Frequent headaches 04/11/2016    Past Surgical History:  Procedure Laterality Date   extraction of wisdom teeth     WISDOM TOOTH EXTRACTION     three removed     OB History   No obstetric history on file.     Family History  Problem Relation Age of Onset   Hypertension Other    Cancer Other    Diabetes Other    Hypertension Mother    Healthy Father     Social History   Tobacco Use   Smoking status: Never   Smokeless tobacco: Never  Vaping Use   Vaping Use: Never used  Substance Use Topics   Alcohol use: Yes    Comment: occ    Drug use: No    Types: Marijuana    Comment: last used about 2 weeks ago      Home Medications Prior to Admission medications   Medication Sig Start Date End Date Taking? Authorizing Provider  naproxen (NAPROSYN) 500 MG tablet Take 1 tablet (500 mg total) by mouth 2 (two) times daily. 07/15/21  Yes Derwood Kaplan, MD  butalbital-acetaminophen-caffeine (FIORICET) 50-325-40 MG tablet Take 1 tablet by mouth every 6 (six) hours as needed for headache. 03/17/19   Mike Gip, FNP  diphenhydrAMINE (BENADRYL) 25 MG tablet Take 25 mg by mouth every 6 (six) hours as needed.    [provider]  fluticasone (FLONASE) 50 MCG/ACT nasal spray Place 2 sprays into both nostrils daily. 03/17/19   Mike Gip, FNP  levocetirizine (XYZAL) 5 MG tablet Take 1 tablet (5 mg total) by mouth every evening. Patient not taking: Reported on 03/17/2019 07/09/18   Mike Gip, FNP  SUMAtriptan (IMITREX) 50 MG tablet Take 1 tablet (50 mg total) by mouth every 2 (two) hours as needed for migraine. May repeat in 2 hours if headache persists or recurs. 03/28/19   Mike Gip, FNP  Burr Medico 150-35 MCG/24HR transdermal patch 150 patches once a week. 04/18/18   [provider]    Allergies    Patient has no known allergies.  Review of Systems   Review of Systems  Constitutional:  Positive for activity change.  Respiratory:  Negative for shortness of breath.   Cardiovascular:  Negative for chest pain.  Gastrointestinal:  Positive for abdominal pain. Negative for nausea and vomiting.  All other systems reviewed and are negative.  Physical Exam Updated Vital Signs BP (!) 142/99   Pulse 71   Temp 98.5 F (36.9 C)   Resp 18   Ht 5\' 1"  (1.549 m)   Wt 61.2 kg   SpO2 100%   BMI 25.51 kg/m   Physical Exam Vitals and nursing note reviewed.  Constitutional:      Appearance: She is well-developed.  HENT:     Head: Atraumatic.  Cardiovascular:     Rate and Rhythm: Normal rate.  Pulmonary:     Effort: Pulmonary effort is normal.  Abdominal:     Tenderness: There is  abdominal tenderness in the epigastric area and left upper quadrant. There is no guarding or rebound.  Musculoskeletal:     Cervical back: Normal range of motion and neck supple.  Skin:    General: Skin is warm and dry.  Neurological:     Mental Status: She is alert and oriented to person, place, and time.    ED Results / Procedures / Treatments   Labs (all labs ordered are listed, but only abnormal results are displayed) Labs Reviewed  COMPREHENSIVE METABOLIC PANEL - Abnormal; Notable for the following components:      Result Value   AST 11 (*)    All other components within normal limits  CBC - Abnormal; Notable for the following components:   WBC 12.4 (*)    MCH 25.8 (*)    All other components within normal limits  URINALYSIS, ROUTINE W REFLEX MICROSCOPIC - Abnormal; Notable for the following components:   Protein, ur TRACE (*)    All other components within normal limits  LIPASE, BLOOD  PREGNANCY, URINE    EKG None  Radiology Abdomen Complete  Result Date: 07/15/2021 CLINICAL DATA:  epigastric and left sided abd pain EXAM: ABDOMEN ULTRASOUND COMPLETE COMPARISON:  None. FINDINGS: Gallbladder: No gallstones or wall thickening visualized. No sonographic Murphy sign noted by sonographer. Common bile duct: Diameter: Normal diameter at 3 mm Liver: No focal lesion identified. Within normal limits in parenchymal echogenicity. Portal vein is patent on color Doppler imaging with normal direction of blood flow towards the liver. IVC: No abnormality visualized. Pancreas: Visualized portion unremarkable. Spleen: Size and appearance within normal limits. Right Kidney: Length: 10.5 cm. Echogenicity within normal limits. No mass or hydronephrosis visualized. Left Kidney: Length: 10.8 cm. Echogenicity within normal limits. No mass or hydronephrosis visualized. Abdominal aorta: No aneurysm visualized. Other findings: None. IMPRESSION: 1. Normal abdominal ultrasound. 2. Normal gallbladder and  liver. Electronically Signed   By: 09/14/2021 M.D.   On: 07/15/2021 14:57    Procedures Procedures   Medications Ordered in ED Medications  alum & mag hydroxide-simeth (MAALOX/MYLANTA) 200-200-20 MG/5ML suspension 30 mL (30 mLs Oral Given 07/15/21 1431)    And  lidocaine (XYLOCAINE) 2 % viscous mouth solution 15 mL (15 mLs Oral Given 07/15/21 1432)  oxyCODONE-acetaminophen (PERCOCET/ROXICET) 5-325 MG per tablet 1 tablet (1 tablet Oral Given 07/15/21 1432)    ED Course  I have reviewed the triage vital signs and the nursing notes.  Pertinent labs & imaging results that were available during my care of the patient were reviewed by me and considered in my medical decision making (see chart for details).    MDM  Rules/Calculators/A&P                           30 year old comes in with chief complaint of abdominal pain.  Patient is noted to have epigastric and left upper quadrant abdominal pain without any splenomegaly.  She does not have any chest pain, and technically she is PERC negative.  Doubt that this is PE related, but did advise her to come back to the ER if she starts having chest pain or shortness of breath.  On exam patient primarily has upper quadrant tenderness, could be PUD.  We have given her a GI cocktail and she said that it did not help.  Her pain is worse with movement, therefore abdominal muscle strain also in the differential diagnosis.  Her lab work-up, ultrasound is reassuring.  We will treat her with NSAIDs and advised over-the-counter antacids.  Stable for discharge.  Strict ER return precautions have been discussed, and patient is agreeing with the plan and is comfortable with the workup done and the recommendations from the ER.   Final Clinical Impression(s) / ED Diagnoses Final diagnoses:  Upper abdominal pain    Rx / DC Orders ED Discharge Orders          Ordered    naproxen (NAPROSYN) 500 MG tablet  2 times daily        07/15/21 1536              Derwood Kaplan, MD 07/15/21 1541

## 2021-07-15 NOTE — ED Triage Notes (Signed)
Patient here POV from Home with ABD Pain.  Patient states she was drinking Alcohol (2-3 drinks) Friday Night and had Episodes of Nausea/Emesis that same Night.   Patient has been having Mid-ABD Pain that radiates around Left Flank and Back.  NAD Noted during Triage. No SOB/CP. Ambulatory. No Urinary Symptoms.

## 2021-11-04 ENCOUNTER — Emergency Department (HOSPITAL_COMMUNITY): Payer: 59

## 2021-11-04 ENCOUNTER — Encounter (HOSPITAL_COMMUNITY): Payer: Self-pay

## 2021-11-04 ENCOUNTER — Emergency Department (HOSPITAL_COMMUNITY)
Admission: EM | Admit: 2021-11-04 | Discharge: 2021-11-04 | Disposition: A | Discharge status: Home / Self Care | Payer: 59 | Attending: Emergency Medicine | Admitting: Emergency Medicine

## 2021-11-04 DIAGNOSIS — R0781 Pleurodynia: Secondary | ICD-10-CM | POA: Insufficient documentation

## 2021-11-04 DIAGNOSIS — M25512 Pain in left shoulder: Secondary | ICD-10-CM | POA: Diagnosis present

## 2021-11-04 DIAGNOSIS — Y92410 Unspecified street and highway as the place of occurrence of the external cause: Secondary | ICD-10-CM | POA: Diagnosis not present

## 2021-11-04 DIAGNOSIS — M7918 Myalgia, other site: Secondary | ICD-10-CM

## 2021-11-04 MED ORDER — METHOCARBAMOL 500 MG PO TABS: 500.0000 mg | ORAL_TABLET | Freq: Two times a day (BID) | ORAL | 0 refills | Status: DC

## 2021-11-04 MED ORDER — NAPROXEN 500 MG PO TABS: 500.0000 mg | ORAL_TABLET | Freq: Two times a day (BID) | ORAL | 0 refills | Status: AC
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
## 2021-11-04 NOTE — Discharge Instructions (Addendum)
Your workup was overall reassuring in the ED today. Please pick up medications and take as needed. DO NOT DRIVE OR DRINK ALCOHOL WHILE ON THE MUSCLE RELAXER AS IT CAN MAKE YOU DROWSY.   Follow up with your PCP for further eval  Return to the ED for any new/worsening symptoms

## 2022-11-23 ENCOUNTER — Encounter (HOSPITAL_COMMUNITY): Payer: Self-pay

## 2022-11-23 ENCOUNTER — Other Ambulatory Visit: Payer: Self-pay

## 2022-11-23 ENCOUNTER — Emergency Department (HOSPITAL_COMMUNITY)
Admission: EM | Admit: 2022-11-23 | Discharge: 2022-11-23 | Disposition: A | Discharge status: Home / Self Care | Payer: 59 | Attending: Emergency Medicine | Admitting: Emergency Medicine

## 2022-11-23 ENCOUNTER — Emergency Department (HOSPITAL_COMMUNITY): Payer: 59

## 2022-11-23 DIAGNOSIS — R112 Nausea with vomiting, unspecified: Secondary | ICD-10-CM | POA: Insufficient documentation

## 2022-11-23 DIAGNOSIS — E876 Hypokalemia: Secondary | ICD-10-CM | POA: Insufficient documentation

## 2022-11-23 DIAGNOSIS — R4781 Slurred speech: Secondary | ICD-10-CM | POA: Insufficient documentation

## 2022-11-23 DIAGNOSIS — R4182 Altered mental status, unspecified: Secondary | ICD-10-CM | POA: Insufficient documentation

## 2022-11-23 LAB — CBC WITH DIFFERENTIAL/PLATELET
Abs Immature Granulocytes: 0.08 10*3/uL — ABNORMAL HIGH (ref 0.00–0.07)
Basophils Absolute: 0 10*3/uL (ref 0.0–0.1)
Basophils Relative: 0 %
Eosinophils Absolute: 0.2 10*3/uL (ref 0.0–0.5)
Eosinophils Relative: 2 %
HCT: 36.4 % (ref 36.0–46.0)
Hemoglobin: 11.1 g/dL — ABNORMAL LOW (ref 12.0–15.0)
Immature Granulocytes: 1 %
Lymphocytes Relative: 24 %
Lymphs Abs: 3.3 10*3/uL (ref 0.7–4.0)
MCH: 25.4 pg — ABNORMAL LOW (ref 26.0–34.0)
MCHC: 30.5 g/dL (ref 30.0–36.0)
MCV: 83.3 fL (ref 80.0–100.0)
Monocytes Absolute: 0.6 10*3/uL (ref 0.1–1.0)
Monocytes Relative: 5 %
Neutro Abs: 9.2 10*3/uL — ABNORMAL HIGH (ref 1.7–7.7)
Neutrophils Relative %: 68 %
Platelets: 294 10*3/uL (ref 150–400)
RBC: 4.37 MIL/uL (ref 3.87–5.11)
RDW: 12.7 % (ref 11.5–15.5)
WBC: 13.5 10*3/uL — ABNORMAL HIGH (ref 4.0–10.5)
nRBC: 0.1 % (ref 0.0–0.2)

## 2022-11-23 LAB — COMPREHENSIVE METABOLIC PANEL
ALT: 9 U/L (ref 0–44)
AST: 17 U/L (ref 15–41)
Albumin: 3.2 g/dL — ABNORMAL LOW (ref 3.5–5.0)
Alkaline Phosphatase: 46 U/L (ref 38–126)
Anion gap: 10 (ref 5–15)
BUN: 9 mg/dL (ref 6–20)
CO2: 22 mmol/L (ref 22–32)
Calcium: 8.6 mg/dL — ABNORMAL LOW (ref 8.9–10.3)
Chloride: 107 mmol/L (ref 98–111)
Creatinine, Ser: 0.67 mg/dL (ref 0.44–1.00)
GFR, Estimated: >60 mL/min (ref >60)
Glucose, Bld: 192 mg/dL — ABNORMAL HIGH (ref 70–99)
Potassium: 2.7 mmol/L — CL (ref 3.5–5.1)
Sodium: 139 mmol/L (ref 135–145)
Total Bilirubin: 0.2 mg/dL — ABNORMAL LOW (ref 0.3–1.2)
Total Protein: 6.7 g/dL (ref 6.5–8.1)

## 2022-11-23 LAB — URINALYSIS, ROUTINE W REFLEX MICROSCOPIC
Bilirubin Urine: NEGATIVE
Glucose, UA: NEGATIVE mg/dL
Hgb urine dipstick: NEGATIVE
Ketones, ur: NEGATIVE mg/dL
Leukocytes,Ua: NEGATIVE
Nitrite: NEGATIVE
Protein, ur: NEGATIVE mg/dL
Specific Gravity, Urine: 1.019 (ref 1.005–1.030)
pH: 5 (ref 5.0–8.0)

## 2022-11-23 LAB — I-STAT BETA HCG BLOOD, ED (MC, WL, AP ONLY): I-stat hCG, quantitative: <5 m[IU]/mL (ref <5)

## 2022-11-23 LAB — RAPID URINE DRUG SCREEN, HOSP PERFORMED
Amphetamines: NOT DETECTED
Barbiturates: NOT DETECTED
Benzodiazepines: NOT DETECTED
Cocaine: NOT DETECTED
Opiates: NOT DETECTED
Tetrahydrocannabinol: POSITIVE — AB

## 2022-11-23 LAB — LIPASE, BLOOD: Lipase: 35 U/L (ref 11–51)

## 2022-11-23 LAB — CBG MONITORING, ED: Glucose-Capillary: 148 mg/dL — ABNORMAL HIGH (ref 70–99)

## 2022-11-23 LAB — ETHANOL: Alcohol, Ethyl (B): <10 mg/dL (ref <10)

## 2022-11-23 MED ORDER — ONDANSETRON HCL 4 MG PO TABS: 4.0000 mg | ORAL_TABLET | Freq: Four times a day (QID) | ORAL | 0 refills | Status: AC

## 2022-11-23 MED ORDER — POTASSIUM CHLORIDE CRYS ER 20 MEQ PO TBCR
40.0000 meq | EXTENDED_RELEASE_TABLET | Freq: Once | ORAL | Status: AC
  Administered 2022-11-23: 40 meq via ORAL
  Filled 2022-11-23: qty 2

## 2022-11-23 MED ORDER — SODIUM CHLORIDE 0.9 % IV BOLUS
1000.0000 mL | Freq: Once | INTRAVENOUS | Status: AC
  Administered 2022-11-23: 1000 mL via INTRAVENOUS

## 2022-11-23 NOTE — ED Notes (Addendum)
This RN and MD Messick at bedside.  CBG done by NT Petrina.  CBG 148.  MD Messick ordering fluid bolus.  MD Messick consulted about bear hugger; no needed at this time per MD.  No further orders at this time.

## 2022-11-23 NOTE — ED Provider Notes (Signed)
Patient seen after prior EDP.  Patient improved post IVF. Now desires DC home. Taking good PO prior to discharge.  Importance of close follow up stressed.  Strict return precautions given and understood.    Valarie Merino, MD 11/23/22 1340
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
## 2022-11-23 NOTE — ED Triage Notes (Signed)
BF called EMS after patient woke up and immediately started vomiting. Ems states that patient was able to tell them her name but keeps falling asleep. BF reports patient has been drinking alcohol, denies drug use. EMS gave 39m Zofran and 3512mSaline en route. VSS.

## 2022-11-23 NOTE — ED Notes (Signed)
MD Messick notified and aware of rectal temp.  MD at bedside.  No new orders.

## 2022-11-23 NOTE — Discharge Instructions (Signed)
Return for any problem.  ?

## 2023-08-03 ENCOUNTER — Ambulatory Visit
Admission: EM | Admit: 2023-08-03 | Discharge: 2023-08-03 | Disposition: A | Discharge status: Home / Self Care | Payer: No Typology Code available for payment source

## 2023-08-03 ENCOUNTER — Encounter: Payer: Self-pay | Admitting: Emergency Medicine

## 2023-08-03 DIAGNOSIS — M25552 Pain in left hip: Secondary | ICD-10-CM | POA: Diagnosis not present

## 2023-08-03 DIAGNOSIS — S91209A Unspecified open wound of unspecified toe(s) with damage to nail, initial encounter: Secondary | ICD-10-CM | POA: Diagnosis not present

## 2023-08-03 NOTE — ED Provider Notes (Signed)
[facility]    CSN: [id number] Arrival date & time: 08/03/23  1321      History   Chief Complaint Chief Complaint  Patient presents with   Nail Problem    HPI Kara Velez is a 32 y.o. female.   Patient here today for evaluation of partial toenail removal that occurred over the weekend.  She reports that she has been keeping it bandaged which has been helpful and has had minimal bleeding but notes that every time she hits her toe now she has more pain.  She is able to lift her toenail off the base of her nail bed slightly.  She denies any numbness or tingling.  She also reports some lateral left hip pain that started yesterday after she had been dancing over the weekend.  She denies any numbness or tingling.  She notes that certain movements including walking worsen pain.  She denies any injury. She has not had any treatment for symptoms.  The history is provided by the patient.    Past Medical History:  Diagnosis Date   Migraine headache    Nexplanon in place     Patient Active Problem List   Diagnosis Date Noted   Migraine without aura and without status migrainosus, not intractable 12/12/2016   Chronic bilateral low back pain without sciatica 12/12/2016   Nexplanon in place 04/11/2016   Frequent headaches 04/11/2016    Past Surgical History:  Procedure Laterality Date   extraction of wisdom teeth     WISDOM TOOTH EXTRACTION     three removed    OB History   No obstetric history on file.      Home Medications    Prior to Admission medications   Medication Sig Start Date End Date Taking? Authorizing Provider  butalbital-acetaminophen-caffeine (FIORICET) 50-325-40 MG tablet Take 1 tablet by mouth every 6 (six) hours as needed for headache. 03/17/19   Mike Gip, FNP  diphenhydrAMINE (BENADRYL) 25 MG tablet Take 25 mg by mouth every 6 (six) hours as needed.    [provider]  fluticasone (FLONASE) 50 MCG/ACT nasal spray Place 2  sprays into both nostrils daily. 03/17/19   Mike Gip, FNP  levocetirizine (XYZAL) 5 MG tablet Take 1 tablet (5 mg total) by mouth every evening. Patient not taking: Reported on 03/17/2019 07/09/18   Mike Gip, FNP  methocarbamol (ROBAXIN) 500 MG tablet Take 1 tablet (500 mg total) by mouth 2 (two) times daily. 11/04/21   Hyman Hopes, Margaux, PA-C  naproxen (NAPROSYN) 500 MG tablet Take 1 tablet (500 mg total) by mouth 2 (two) times daily. 07/15/21   Derwood Kaplan, MD  naproxen (NAPROSYN) 500 MG tablet Take 1 tablet (500 mg total) by mouth 2 (two) times daily. 11/04/21   Venter, Margaux, PA-C  ondansetron (ZOFRAN) 4 MG tablet Take 1 tablet (4 mg total) by mouth every 6 (six) hours. 11/23/22   Wynetta Fines, MD  SUMAtriptan (IMITREX) 50 MG tablet Take 1 tablet (50 mg total) by mouth every 2 (two) hours as needed for migraine. May repeat in 2 hours if headache persists or recurs. 03/28/19   Mike Gip, FNP  Burr Medico 150-35 MCG/24HR transdermal patch 150 patches once a week. 04/18/18   [provider]    Family History Family History  Problem Relation Age of Onset   Hypertension Other    Cancer Other    Diabetes Other    Hypertension Mother    Healthy Father     Social History  Social History   Tobacco Use   Smoking status: Never   Smokeless tobacco: Never  Vaping Use   Vaping status: Never Used  Substance Use Topics   Alcohol use: Yes    Comment: occ    Drug use: No    Types: Marijuana    Comment: last used about 2 weeks ago      Allergies   Patient has no known allergies.   Review of Systems Review of Systems  Constitutional:  Negative for chills and fever.  HENT:  Negative for congestion and rhinorrhea.   Eyes:  Negative for discharge and redness.  Respiratory:  Negative for shortness of breath.   Gastrointestinal:  Negative for abdominal pain, diarrhea, nausea and vomiting.  Musculoskeletal:  Positive for arthralgias. Negative for joint swelling.   Neurological:  Negative for numbness.     Physical Exam Triage Vital Signs ED Triage Vitals [08/03/23 1348]  Encounter Vitals Group     BP 114/78     Systolic BP Percentile      Diastolic BP Percentile      Pulse Rate 72     Resp 16     Temp 98.5 F (36.9 C)     Temp Source Oral     SpO2 97 %     Weight      Height      Head Circumference      Peak Flow      Pain Score      Pain Loc      Pain Education      Exclude from Growth Chart    No data found.  Updated Vital Signs BP 114/78 (BP Location: Left Arm)   Pulse 72   Temp 98.5 F (36.9 C) (Oral)   Resp 16   LMP 07/31/2023   SpO2 97%      Physical Exam Vitals and nursing note reviewed.  Constitutional:      General: She is not in acute distress.    Appearance: Normal appearance. She is not ill-appearing.  HENT:     Head: Normocephalic and atraumatic.  Eyes:     Conjunctiva/sclera: Conjunctivae normal.  Cardiovascular:     Rate and Rhythm: Normal rate.  Pulmonary:     Effort: Pulmonary effort is normal. No respiratory distress.  Musculoskeletal:     Comments: Antalgic gait noted with weight bearing to left leg  Skin:    Capillary Refill: Normal cap refill to right great toe    Comments: Partial nail avulsion noted to right great toe, no active bleeding  Neurological:     Mental Status: She is alert.     Comments: Gross sensation intact to distal right great toe  Psychiatric:        Mood and Affect: Mood normal.        Behavior: Behavior normal.        Thought Content: Thought content normal.      UC Treatments / Results  Labs (all labs ordered are listed, but only abnormal results are displayed) Labs Reviewed - No data to display  EKG   Radiology No results found.  Procedures Procedures (including critical care time)  Medications Ordered in UC Medications - No data to display  Initial Impression / Assessment and Plan / UC Course  I have reviewed the triage vital signs and the  nursing notes.  Pertinent labs & imaging results that were available during my care of the patient were reviewed by me and considered in my  medical decision making (see chart for details).    Given only partial avulsion of toenail discussed options of toenail removal versus recommended salvage of toenail and keeping area wrapped to prevent further trauma.  Patient is agreeable with this recommendation.  Recommended ibuprofen for both to pain and hip pain as I suspect IT band syndrome from dancing.  Encouraged stretching prior to physical activity to hopefully prevent future pain.  Encouraged follow-up with any further concerns or persistent symptoms.  Final Clinical Impressions(s) / UC Diagnoses   Final diagnoses:  Toenail avulsion, initial encounter  Left hip pain   Discharge Instructions   None    ED Prescriptions   None    PDMP not reviewed this encounter.   Tomi Bamberger, PA-C 08/03/23 1718

## 2023-08-03 NOTE — ED Triage Notes (Signed)
Has acrylic nails on toes. Nail got ripped two days ago. Has been keeping toe bandaged since start of injury due to bleeding. Able to lift base of toenail off toe.   Also c/o left hip pain starting yesterday causing her to be unable to stand/walk without pain. No known injury. Denies back pain, leg pain, knee pain. Denies numbness, tingling, weakness. Did go out dancing the night prior to the pain.

## 2023-11-23 ENCOUNTER — Ambulatory Visit
Admission: EM | Admit: 2023-11-23 | Discharge: 2023-11-23 | Disposition: A | Discharge status: Home / Self Care | Payer: No Typology Code available for payment source | Attending: Family Medicine | Admitting: Family Medicine

## 2023-11-23 DIAGNOSIS — J111 Influenza due to unidentified influenza virus with other respiratory manifestations: Secondary | ICD-10-CM | POA: Diagnosis not present

## 2023-11-23 LAB — POCT INFLUENZA A/B
Influenza A, POC: NEGATIVE
Influenza B, POC: NEGATIVE

## 2023-11-23 MED ORDER — CETIRIZINE HCL 10 MG PO TABS: 10.0000 mg | ORAL_TABLET | Freq: Every day | ORAL | 0 refills | Status: AC

## 2023-11-23 MED ORDER — PROMETHAZINE-DM 6.25-15 MG/5ML PO SYRP: 5.0000 mL | ORAL_SOLUTION | Freq: Three times a day (TID) | ORAL | 0 refills | Status: AC | PRN

## 2023-11-23 MED ORDER — IBUPROFEN 600 MG PO TABS: 600.0000 mg | ORAL_TABLET | Freq: Four times a day (QID) | ORAL | 0 refills | Status: AC | PRN

## 2023-11-23 MED ORDER — PSEUDOEPHEDRINE HCL 30 MG PO TABS: 30.0000 mg | ORAL_TABLET | Freq: Three times a day (TID) | ORAL | 0 refills | Status: AC | PRN

## 2023-11-23 MED ORDER — OSELTAMIVIR PHOSPHATE 75 MG PO CAPS: 75.0000 mg | ORAL_CAPSULE | Freq: Two times a day (BID) | ORAL | 0 refills | Status: AC

## 2023-11-23 MED ORDER — ACETAMINOPHEN 325 MG PO TABS
650.0000 mg | ORAL_TABLET | Freq: Once | ORAL | Status: AC
  Administered 2023-11-23: 650 mg via ORAL

## 2023-11-23 MED ORDER — ACETAMINOPHEN 325 MG PO TABS: 650.0000 mg | ORAL_TABLET | Freq: Four times a day (QID) | ORAL | 0 refills | Status: AC | PRN

## 2023-11-23 NOTE — ED Provider Notes (Signed)
 [facility]  Note:  This document was prepared using Conservation officer, historic buildings and may include unintentional dictation errors.  MRN: [id number] DOB: [birth year]  Subjective:   Kara Velez is a 33 y.o. female presenting for 2-day history of malaise, fatigue, sinus congestion, hot and cold chills, coughing.  Had close exposure to 2 contacts with the flu.  No asthma.  No smoking of any kind including cigarettes, cigars, vaping, marijuana use.     Current Facility-Administered Medications:    lidocaine  (XYLOCAINE ) 0.5 % (with pres) injection 5 mL, 5 mL, Intradermal, Once, Hollis, Lachina M, FNP  Current Outpatient Medications:    butalbital -acetaminophen -caffeine  (FIORICET) 50-325-40 MG tablet, Take 1 tablet by mouth every 6 (six) hours as needed for headache., Disp: 45 tablet, Rfl: 1   diphenhydrAMINE (BENADRYL) 25 MG tablet, Take 25 mg by mouth every 6 (six) hours as needed., Disp: , Rfl:    fluticasone  (FLONASE ) 50 MCG/ACT nasal spray, Place 2 sprays into both nostrils daily., Disp: 16 g, Rfl: 6   levocetirizine (XYZAL ) 5 MG tablet, Take 1 tablet (5 mg total) by mouth every evening. (Patient not taking: Reported on 03/17/2019), Disp: 30 tablet, Rfl: 0   methocarbamol  (ROBAXIN ) 500 MG tablet, Take 1 tablet (500 mg total) by mouth 2 (two) times daily., Disp: 20 tablet, Rfl: 0   naproxen  (NAPROSYN ) 500 MG tablet, Take 1 tablet (500 mg total) by mouth 2 (two) times daily., Disp: 20 tablet, Rfl: 0   naproxen  (NAPROSYN ) 500 MG tablet, Take 1 tablet (500 mg total) by mouth 2 (two) times daily., Disp: 30 tablet, Rfl: 0   ondansetron  (ZOFRAN ) 4 MG tablet, Take 1 tablet (4 mg total) by mouth every 6 (six) hours., Disp: 12 tablet, Rfl: 0   SUMAtriptan  (IMITREX ) 50 MG tablet, Take 1 tablet (50 mg total) by mouth every 2 (two) hours as needed for migraine. May repeat in 2 hours if headache persists or recurs., Disp: 10 tablet, Rfl: 2   XULANE 150-35 MCG/24HR transdermal  patch, 150 patches once a week., Disp: , Rfl: 10   No Known Allergies  Past Medical History:  Diagnosis Date   Migraine headache    Nexplanon in place      Past Surgical History:  Procedure Laterality Date   extraction of wisdom teeth     WISDOM TOOTH EXTRACTION     three removed    Family History  Problem Relation Age of Onset   Hypertension Other    Cancer Other    Diabetes Other    Hypertension Mother    Healthy Father     Social History   Tobacco Use   Smoking status: Never   Smokeless tobacco: Never  Vaping Use   Vaping status: Never Used  Substance Use Topics   Alcohol use: Yes    Comment: occ    Drug use: No    Types: Marijuana    Comment: last used about 2 weeks ago     ROS   Objective:   Vitals: BP 120/84 (BP Location: Right Arm)   Pulse (!) 103   Temp (!) 100.7 F (38.2 C) (Oral)   Resp 18   LMP  (Within Weeks) Comment: 3 weeks  SpO2 97%   Physical Exam Constitutional:      General: She is not in acute distress.    Appearance: Normal appearance. She is well-developed and normal weight. She is ill-appearing. She is not toxic-appearing or diaphoretic.  HENT:  Head: Normocephalic and atraumatic.     Right Ear: Tympanic membrane, ear canal and external ear normal. No drainage or tenderness. No middle ear effusion. There is no impacted cerumen. Tympanic membrane is not erythematous or bulging.     Left Ear: Tympanic membrane, ear canal and external ear normal. No drainage or tenderness.  No middle ear effusion. There is no impacted cerumen. Tympanic membrane is not erythematous or bulging.     Nose: Nose normal. No congestion or rhinorrhea.     Mouth/Throat:     Mouth: Mucous membranes are moist. No oral lesions.     Pharynx: No pharyngeal swelling, oropharyngeal exudate, posterior oropharyngeal erythema or uvula swelling.     Tonsils: No tonsillar exudate or tonsillar abscesses.  Eyes:     General: No scleral icterus.       Right eye: No  discharge.        Left eye: No discharge.     Extraocular Movements: Extraocular movements intact.     Right eye: Normal extraocular motion.     Left eye: Normal extraocular motion.     Conjunctiva/sclera: Conjunctivae normal.  Cardiovascular:     Rate and Rhythm: Normal rate and regular rhythm.     Heart sounds: Normal heart sounds. No murmur heard.    No friction rub. No gallop.  Pulmonary:     Effort: Pulmonary effort is normal. No respiratory distress.     Breath sounds: No stridor. No wheezing, rhonchi or rales.  Chest:     Chest wall: No tenderness.  Musculoskeletal:     Cervical back: Normal range of motion and neck supple.  Lymphadenopathy:     Cervical: No cervical adenopathy.  Skin:    General: Skin is warm and dry.  Neurological:     General: No focal deficit present.     Mental Status: She is alert and oriented to person, place, and time.  Psychiatric:        Mood and Affect: Mood normal.        Behavior: Behavior normal.    Results for orders placed or performed during the hospital encounter of 11/23/23 (from the past 24 hours)  POCT Influenza A/B     Status: Normal   Collection Time: 11/23/23  2:07 PM  Result Value Ref Range   Influenza A, POC Negative    Influenza B, POC Negative      Assessment and Plan :   PDMP not reviewed this encounter.  1. Influenza    Suspect a false negative.  Deferred imaging given clear cardiopulmonary exam, hemodynamically stable vital signs.  Will cover for influenza with Tamiflu  given exposure, symptom set, current incidence in the community.  Use supportive care, rest, fluids, hydration, light meals, schedule Tylenol  and ibuprofen . Counseled patient on potential for adverse effects with medications prescribed today, patient verbalized understanding. ER and return-to-clinic precautions discussed, patient verbalized understanding.    Adolph Hoop, PA-C 11/23/23 1435

## 2023-11-23 NOTE — Discharge Instructions (Signed)
 For sore throat or cough try using a honey-based tea. Use 3 teaspoons of honey with juice squeezed from half lemon. Place shaved pieces of ginger into 1/2-1 cup of water and warm over stove top. Then mix the ingredients and repeat every 4 hours as needed. Please take ibuprofen 600mg  every 6 hours with food alternating with OR taken together with Tylenol 500mg -650mg  every 6 hours for throat pain, fevers, aches and pains. Hydrate very well with at least 2 liters of water. Eat light meals such as soups (chicken and noodles, vegetable, chicken and wild rice).  Do not eat foods that you are allergic to.  Taking an antihistamine like Zyrtec (10mg  daily) can help against postnasal drainage, sinus congestion which can cause sinus pain, sinus headaches, throat pain, painful swallowing, coughing.  You can take this together with pseudoephedrine (Sudafed) at a dose of 30 mg 3 times a day or twice daily as needed for the same kind of nasal drip, congestion.  Use cough syrup as needed.

## 2023-11-23 NOTE — ED Triage Notes (Signed)
 Pt reports cough, nasal congestion, headache, chills, fatigue and headache x 2 days. States friend has Flu. Pt has not taken any meds fro complaints.

## 2024-04-28 ENCOUNTER — Encounter (INDEPENDENT_AMBULATORY_CARE_PROVIDER_SITE_OTHER): Payer: Self-pay

## 2024-04-28 ENCOUNTER — Other Ambulatory Visit: Payer: Self-pay

## 2024-04-28 ENCOUNTER — Encounter (HOSPITAL_BASED_OUTPATIENT_CLINIC_OR_DEPARTMENT_OTHER): Payer: Self-pay

## 2024-04-28 ENCOUNTER — Emergency Department (HOSPITAL_BASED_OUTPATIENT_CLINIC_OR_DEPARTMENT_OTHER)
Admission: EM | Admit: 2024-04-28 | Discharge: 2024-04-28 | Disposition: A | Discharge status: Home / Self Care | Attending: Emergency Medicine | Admitting: Emergency Medicine

## 2024-04-28 ENCOUNTER — Emergency Department (HOSPITAL_BASED_OUTPATIENT_CLINIC_OR_DEPARTMENT_OTHER): Admitting: Radiology

## 2024-04-28 DIAGNOSIS — Y92410 Unspecified street and highway as the place of occurrence of the external cause: Secondary | ICD-10-CM | POA: Insufficient documentation

## 2024-04-28 DIAGNOSIS — G43909 Migraine, unspecified, not intractable, without status migrainosus: Secondary | ICD-10-CM | POA: Insufficient documentation

## 2024-04-28 DIAGNOSIS — M25512 Pain in left shoulder: Secondary | ICD-10-CM | POA: Insufficient documentation

## 2024-04-28 DIAGNOSIS — M542 Cervicalgia: Secondary | ICD-10-CM | POA: Insufficient documentation

## 2024-04-28 MED ORDER — ACETAMINOPHEN 500 MG PO TABS
1000.0000 mg | ORAL_TABLET | Freq: Once | ORAL | Status: AC
  Administered 2024-04-28: 1000 mg via ORAL
  Filled 2024-04-28: qty 2

## 2024-04-28 MED ORDER — METHOCARBAMOL 500 MG PO TABS
500.0000 mg | ORAL_TABLET | Freq: Two times a day (BID) | ORAL | 0 refills | Status: AC
Start: 2024-04-28 — End: ?
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
## 2024-04-28 NOTE — Discharge Instructions (Addendum)
 I have sent Robaxin  to your pharmacy.  Please do not take this medicine if pregnant.  Please follow-up with your primary care provider.  Seek emergency care for experiencing any new or worsening symptoms.  Alternating between 650 mg Tylenol  and 400 mg Advil : The best way to alternate taking Acetaminophen  (example Tylenol ) and Ibuprofen  (example Advil /Motrin ) is to take them 3 hours apart. For example, if you take ibuprofen  at 6 am you can then take Tylenol  at 9 am. You can continue this regimen throughout the day, making sure you do not exceed the recommended maximum dose for each drug.
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
## 2024-06-29 ENCOUNTER — Ambulatory Visit: Attending: Neurological Surgery | Admitting: Physical Therapy

## 2024-06-29 ENCOUNTER — Other Ambulatory Visit: Payer: Self-pay

## 2024-06-29 ENCOUNTER — Encounter: Payer: Self-pay | Admitting: Physical Therapy

## 2024-06-29 DIAGNOSIS — M6281 Muscle weakness (generalized): Secondary | ICD-10-CM | POA: Diagnosis present

## 2024-06-29 DIAGNOSIS — M5416 Radiculopathy, lumbar region: Secondary | ICD-10-CM | POA: Insufficient documentation

## 2024-06-29 NOTE — Therapy (Signed)
 OUTPATIENT PHYSICAL THERAPY THORACOLUMBAR EVALUATION   Patient Name: Kara Velez MRN: [id number] DOB:November 06, 1990, 33 y.o., female Today's Date: 06/29/2024  END OF SESSION:  PT End of Session - 06/29/24 0810     Visit Number 1    Date for PT Re-Evaluation 08/24/24    Authorization Type Medicaid [employer] no auth until 27 visits    PT Start Time 0808    PT Stop Time 0845    PT Time Calculation (min) 37 min    Activity Tolerance Patient tolerated treatment well          Past Medical History:  Diagnosis Date   Migraine headache    Nexplanon in place    Past Surgical History:  Procedure Laterality Date   extraction of wisdom teeth     WISDOM TOOTH EXTRACTION     three removed   Patient Active Problem List   Diagnosis Date Noted   Migraine without aura and without status migrainosus, not intractable 12/12/2016   Chronic bilateral low back pain without sciatica 12/12/2016   Nexplanon in place 04/11/2016   Frequent headaches 04/11/2016    PCP: Vicenta Maduro FNP  REFERRING PROVIDER: Joshua Alm Hamilton MD  REFERRING DIAG: M54.16 chronic radicular pain of lower back  Rationale for Evaluation and Treatment: Rehabilitation  THERAPY DIAG:  Back pain; weakness ONSET DATE: 2 years with exacerbation in last 6 months  SUBJECTIVE:                                                                                                                                                                                           SUBJECTIVE STATEMENT: 2 year history of back pain ; worsened in the last 6 months for no apparent reason but driving more for work and carrying heavy stuff;walking OK Former Editor, commissioning  Never had PT before  PERTINENT HISTORY:  migraines  PAIN:   Are you having pain? Yes NPRS scale: 8/10 Pain location: lower back and left buttock; denies LE pain, numbness or tingling symptoms Pain orientation: Bilateral  PAIN TYPE: aching and  burning Pain description: constant  Aggravating factors: driving for long; now sitting at a desk all day; bending; standing; lifting; sleeping Relieving factors: shifting positions   PRECAUTIONS: None    WEIGHT BEARING RESTRICTIONS: No  FALLS:  Has patient fallen in last 6 months? 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  DIAGNOSTIC FINDINGS:  X-rays done with signs of scoliosis but unsure; waiting for MRI to be scheduled  PATIENT SURVEYS:  Modified Oswestry:  MODIFIED OSWESTRY DISABILITY SCALE  Date: 9/17 Score  Pain intensity 4 =  Pain medication provides me with little relief from pain.  2. Personal care (washing, dressing, etc.) 1 =  I can take care of myself normally, but it increases my pain.  3. Lifting 1 = I can lift heavy weights, but it causes increased pain.  4. Walking 1 = Pain prevents me from walking more than 1 mile.  5. Sitting 1 =  I can only sit in my favorite chair as long as I like.  6. Standing 1 =  I can stand as long as I want but, it increases my pain.  7. Sleeping 3 =  Even when I take pain medication, I sleep less than 4 hours.  8. Social Life 2 = Pain prevents me from participating in more energetic activities (eg. sports, dancing).  9. Traveling 2 =  My pain restricts my travel over 2 hours.  10. Employment/ Homemaking 1 = My normal homemaking/job activities increase my pain, but I can still perform all that is required of me  Total 34%   Interpretation of scores: Score Category Description  0-20% Minimal Disability The patient can cope with most living activities. Usually no treatment is indicated apart from advice on lifting, sitting and exercise  21-40% Moderate Disability The  patient experiences more pain and difficulty with sitting, lifting and standing. Travel and social life are more difficult and they may be disabled from work. Personal care, sexual activity and sleeping are not grossly affected, and the patient can usually be managed by conservative means  41-60% Severe Disability Pain remains the main problem in this group, but activities of daily living are affected. These patients require a detailed investigation  61-80% Crippled Back pain impinges on all aspects of the patient's life. Positive intervention is required  81-100% Bed-bound  These patients are either bed-bound or exaggerating their symptoms  [name], et al. Surgery versus conservative management of stable thoracolumbar fracture: the PRESTO feasibility RCT. [place] ([place]): [employer]; 2021 Nov. [facility] Technology Assessment, No. 25.62.) Appendix 3, Oswestry Disability Index category descriptors. Available from: [employer]el[employer]cz  Minimally Clinically Important Difference (MCID) = 12.8%  COGNITION: Overall cognitive status: Within functional limits for tasks assessed     MUSCLE LENGTH: Hamstrings: > 90 degrees bil   POSTURE: increased lumbar lordosis  PALPATION: Mild tenderness lumbar musculature, not tender over spinous processes  LUMBAR ROM:   AROM eval  Flexion 75 pt states she normally can put palms on the floor  Extension 30  Right lateral flexion 25  Left lateral flexion 25  Right rotation   Left rotation    (Blank rows = not tested)  TRUNK STRENGTH:  Decreased activation of transverse abdominus muscles; abdominals 4-/5; decreased activation of lumbar multifidi; trunk extensors 4-/5;   marked difficulty stabilizing with bird dogs and with excessive lumbar lordosis; difficulty stabilizing with left single leg standing   LOWER EXTREMITY ROM:   grossly WFLS  LOWER EXTREMITY MMT:  able to rise from standard chair  without UE use with ease  MMT Right eval Left eval  Hip flexion 5 5  Hip extension 5 4  Hip abduction 4+ 4-  Hip adduction    Hip internal rotation 5 5  Hip external rotation 5 4  Knee flexion    Knee extension    Ankle dorsiflexion    Ankle plantarflexion    Ankle inversion    Ankle eversion     (Blank rows = not tested)  LUMBAR SPECIAL TESTS:  Negative slump Negative SLR   GAIT:  Comments: WNLs  TREATMENT DATE: 9/17 evaluation    Use of lumbar roll when sitting at work; frequent standing/walking breaks Discussion of plan of care to focus on core stabilization  Discussed how to find transverse abdominus muscles and activation in supine, sidelying and standing                                                                                                                             PATIENT EDUCATION:  Education details: Educated patient on anatomy and physiology of current symptoms, prognosis, plan of care as well as initial self care strategies to promote recovery Person educated: Patient Education method: Explanation Education comprehension: verbalized understanding  HOME EXERCISE PROGRAM: To be started  ASSESSMENT:  CLINICAL IMPRESSION: Patient is a 32 y.o. female who was seen today for physical therapy evaluation and treatment for chronic lumbar pain and intermittent left buttock pain. The patient is generally hypermobile and would benefit from core stabilization and strengthening to lumbo/pelvic and hip regions.  Deficits and pain levels are affecting activities of daily living at home and work including sitting, standing, sleeping, lifting and performing hobbies and recreational activities.     OBJECTIVE IMPAIRMENTS: decreased activity tolerance, decreased mobility, decreased strength, impaired perceived functional ability, and pain.   ACTIVITY LIMITATIONS: carrying, lifting, bending, sitting, sleeping, and locomotion level  PARTICIPATION LIMITATIONS: meal  prep, cleaning, laundry, driving, shopping, community activity, and occupation  PERSONAL FACTORS: Time since onset of injury/illness/exacerbation are also affecting patient's functional outcome.   REHAB POTENTIAL: Good  CLINICAL DECISION MAKING: Stable/uncomplicated  EVALUATION COMPLEXITY: Low   GOALS: Goals reviewed with patient? Yes  SHORT TERM GOALS: Target date: 07/27/2024    The patient will demonstrate knowledge of basic self care strategies and exercises to promote healing  Baseline: Goal status: INITIAL  2.  The patient will report a 30% improvement in pain levels with functional activities which are currently difficult including sitting, bending and sleeping Baseline:  Goal status: INITIAL  3.  The patient will have improved left hip strength to 4/5 needed for single limb standing activities including curbs, steps, getting on/off the floor and kneeling  Baseline:  Goal status: INITIAL    LONG TERM GOALS: Target date: 08/24/2024   The patient will be independent in a safe self progression of a home exercise program to promote further recovery of function  Baseline:  Goal status: INITIAL  2.  The patient will report a 75% improvement in pain levels with functional activities which are currently difficult including sitting, bending and sleeping Baseline:  Goal status: INITIAL  3.  The patient will have improved trunk flexor and extensor muscle strength to at least 4+/5 needed  for lifting medium weight objects such as grocery bags, laundry and luggage  Baseline:  Goal status: INITIAL  4.  The patient will have improved left hip strength to at least 4+/5 needed for standing, walking longer distances and recreational activities like dancing Baseline:  Goal status: INITIAL  5.  Modified Oswestry functional outcome measure score improved to  24 % indicating improved function with ADLS with less pain.  Baseline:  Goal status: INITIAL  PLAN:  PT FREQUENCY:  2x/week  PT DURATION: 8 weeks  PLANNED INTERVENTIONS: 97164- PT Re-evaluation, 97110-Therapeutic exercises, 97530- Therapeutic activity, 97112- Neuromuscular re-education, 97535- Self Care, 02859- Manual therapy, [phone]- Aquatic Therapy, [phone]- Electrical stimulation (unattended), [phone]- Electrical stimulation (manual), [id number]- Ultrasound, [id number]- Traction (mechanical), [id number]- Ionotophoresis 4mg /ml Dexamethasone, 79439 (1-2 muscles), 20561 (3+ muscles)- Dry Needling, Patient/Family education, Taping, Joint mobilization, Spinal manipulation, Spinal mobilization, Cryotherapy, and Moist heat.  PLAN FOR NEXT SESSION: initiate core stabilization supine, quadruped and standing; left gluteal strengthening; start HEP  Glade Pesa, PT 06/29/24 10:04 AM Phone: [phone] Fax: [phone]

## 2024-07-05 ENCOUNTER — Ambulatory Visit: Admitting: Physical Therapy

## 2024-07-07 ENCOUNTER — Encounter: Admitting: Physical Therapy

## 2024-07-12 ENCOUNTER — Ambulatory Visit

## 2024-07-12 DIAGNOSIS — M5416 Radiculopathy, lumbar region: Secondary | ICD-10-CM | POA: Diagnosis not present

## 2024-07-12 DIAGNOSIS — M6281 Muscle weakness (generalized): Secondary | ICD-10-CM

## 2024-07-12 NOTE — Therapy (Signed)
 OUTPATIENT PHYSICAL THERAPY TREATMENT   Patient Name: Kara Velez MRN: [id number] DOB:[birth year], 33 y.o., female Today's Date: 07/12/2024  END OF SESSION:  PT End of Session - 07/12/24 0849     Visit Number 2    Date for Recertification  08/24/24    Authorization Type Medicaid [employer] no auth until 27 visits    Authorization - Visit Number 2    Authorization - Number of Visits 27    PT Start Time 0810   late   PT Stop Time 0845    PT Time Calculation (min) 35 min    Activity Tolerance Patient tolerated treatment well    Behavior During Therapy WFL for tasks assessed/performed           Past Medical History:  Diagnosis Date   Migraine headache    Nexplanon in place    Past Surgical History:  Procedure Laterality Date   extraction of wisdom teeth     WISDOM TOOTH EXTRACTION     three removed   Patient Active Problem List   Diagnosis Date Noted   Migraine without aura and without status migrainosus, not intractable 12/12/2016   Chronic bilateral low back pain without sciatica 12/12/2016   Nexplanon in place 04/11/2016   Frequent headaches 04/11/2016    PCP: Vicenta Maduro FNP  REFERRING PROVIDER: Joshua Alm Hamilton MD  REFERRING DIAG: M54.16 chronic radicular pain of lower back  Rationale for Evaluation and Treatment: Rehabilitation  THERAPY DIAG:  Back pain; weakness ONSET DATE: 2 years with exacerbation in last 6 months  SUBJECTIVE:                                                                                                                                                                                           SUBJECTIVE STATEMENT:  I have been trying to get up and move around more.  I haven't tried a lumbar roll yet.    Never had PT before  PERTINENT HISTORY:  migraines  PAIN: 07/12/24  Are you having pain? Yes NPRS scale: 3/10 Pain location: lower back and left buttock; denies LE pain, numbness or tingling symptoms Pain  orientation: Bilateral  PAIN TYPE: aching and burning Pain description: constant  Aggravating factors: driving for long; now sitting at a desk all day; bending; standing; lifting; sleeping Relieving factors: shifting positions   PRECAUTIONS: None    WEIGHT BEARING RESTRICTIONS: No  FALLS:  Has patient fallen in last 6 months? 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  DIAGNOSTIC FINDINGS:  X-rays done with signs of scoliosis but unsure; waiting for MRI to be scheduled  PATIENT SURVEYS:  Modified Oswestry:  MODIFIED OSWESTRY DISABILITY SCALE  Date: 9/17 Score  Pain intensity 4 =  Pain medication provides me with little relief from pain.  2. Personal care (washing, dressing, etc.) 1 =  I can take care of myself normally, but it increases my pain.  3. Lifting 1 = I can lift heavy weights, but it causes increased pain.  4. Walking 1 = Pain prevents me from walking more than 1 mile.  5. Sitting 1 =  I can only sit in my favorite chair as long as I like.  6. Standing 1 =  I can stand as long as I want but, it increases my pain.  7. Sleeping 3 =  Even when I take pain medication, I sleep less than 4 hours.  8. Social Life 2 = Pain prevents me from participating in more energetic activities (eg. sports, dancing).  9. Traveling 2 =  My pain restricts my travel over 2 hours.  10. Employment/ Homemaking 1 = My normal homemaking/job activities increase my pain, but I can still perform all that is required of me  Total 34%   Interpretation of scores: Score Category Description  0-20% Minimal Disability The patient can cope with most living activities. Usually no treatment is indicated apart from advice on lifting, sitting and  exercise  21-40% Moderate Disability The patient experiences more pain and difficulty with sitting, lifting and standing. Travel and social life are more difficult and they may be disabled from work. Personal care, sexual activity and sleeping are not grossly affected, and the patient can usually be managed by conservative means  41-60% Severe Disability Pain remains the main problem in this group, but activities of daily living are affected. These patients require a detailed investigation  61-80% Crippled Back pain impinges on all aspects of the patient's life. Positive intervention is required  81-100% Bed-bound  These patients are either bed-bound or exaggerating their symptoms  [name], et al. Surgery versus conservative management of stable thoracolumbar fracture: the PRESTO feasibility RCT. [place] ([place]): [employer]; 2021 Nov. [facility] Technology Assessment, No. 25.62.) Appendix 3, Oswestry Disability Index category descriptors. Available from: FindJewel[employer].cz  Minimally Clinically Important Difference (MCID) = 12.8%  COGNITION: Overall cognitive status: Within functional limits for tasks assessed     MUSCLE LENGTH: Hamstrings: > 90 degrees bil   POSTURE: increased lumbar lordosis  PALPATION: Mild tenderness lumbar musculature, not tender over spinous processes  LUMBAR ROM:   AROM eval  Flexion 75 pt states she normally can put palms on the floor  Extension 30  Right lateral flexion 25  Left lateral flexion 25  Right rotation   Left rotation    (Blank rows = not tested)  TRUNK STRENGTH:  Decreased activation of transverse abdominus muscles; abdominals 4-/5; decreased activation of lumbar multifidi; trunk extensors 4-/5;   marked difficulty stabilizing with bird dogs and with excessive lumbar lordosis; difficulty stabilizing with left single leg standing   LOWER EXTREMITY ROM:   grossly WFLS  LOWER EXTREMITY  MMT:  able to rise from standard chair without UE use with ease  MMT Right eval Left eval  Hip flexion 5 5  Hip extension 5 4  Hip abduction 4+ 4-  Hip adduction    Hip internal rotation 5 5  Hip external rotation 5 4  Knee flexion    Knee extension    Ankle dorsiflexion    Ankle plantarflexion    Ankle inversion    Ankle eversion     (Blank rows = not tested)  LUMBAR SPECIAL TESTS:  Negative slump Negative SLR   GAIT:  Comments: WNLs  TREATMENT DATE:  07/12/24 NuStep: level 5x 6 minutes -PT present to discuss progress  Seated hamstring and figure 4- advised to use this during long stretches at desk Sit to stand with 5# kettlebell.  Core activation -parallel stance and staggered stance TA activation: supine with ball squeeze 5 hold x10  Sidelying clam 2x10 bil  Standing hip abduction and extension x10 each   9/17 evaluation    Use of lumbar roll when sitting at work; frequent standing/walking breaks Discussion of plan of care to focus on core stabilization  Discussed how to find transverse abdominus muscles and activation in supine, sidelying and standing                                                                                                                             PATIENT EDUCATION:  Education details: Educated patient on anatomy and physiology of current symptoms, prognosis, plan of care as well as initial self care strategies to promote recovery Person educated: Patient Education method: Explanation Education comprehension: verbalized understanding  HOME EXERCISE PROGRAM: Access Code: [email or web address] URL: [email or web address] Date: 07/12/2024 Prepared by: Burnard  Exercises - Seated Hamstring Stretch  - 3 x daily - 7 x weekly - 1 sets - 3 reps - 20 hold - Seated Figure 4 Piriformis Stretch  - 3 x daily - 7 x weekly - 1 sets - 3 reps - 30 hold - Hooklying Transversus Abdominis Palpation  - 3 x daily - 7 x weekly - 1 sets - 10 reps - 5  hold - Seated Transversus Abdominis Bracing  - 1 x daily - 7 x weekly - 1 sets - 10 reps - 5 hold - Supine Hip Adduction Isometric with Ball  - 1-2 x daily - 7 x weekly - 3 sets - 10 reps - Clamshell  - 1-2 x daily - 7 x weekly - 2 sets - 10 reps - Resisted Sit-to-Stand With Dumbbell at Chest  - 2 x daily - 7 x weekly - 2 sets - 10 reps  ASSESSMENT:  CLINICAL IMPRESSION: First time follow-up after evaluation.  Pt has been changing positions at work and is more aware of her core.  Session focused on initiating a HEP for core stabilization.  PT added gentle flexibility so that patient can do this for change of position at work.  She demonstrated fatigue with hip strength exercises.  She tolerated all without increased pain today.  PT monitored throughout session for cueing. Patient will benefit from skilled PT to address the below impairments  and improve overall function.    OBJECTIVE IMPAIRMENTS: decreased activity tolerance, decreased mobility, decreased strength, impaired perceived functional ability, and pain.   ACTIVITY LIMITATIONS: carrying, lifting, bending, sitting, sleeping, and locomotion level  PARTICIPATION LIMITATIONS: meal prep, cleaning, laundry, driving, shopping, community activity, and occupation  PERSONAL FACTORS: Time since onset of injury/illness/exacerbation are also affecting patient's functional outcome.   REHAB POTENTIAL: Good  CLINICAL DECISION MAKING: Stable/uncomplicated  EVALUATION COMPLEXITY: Low   GOALS: Goals reviewed with patient? Yes  SHORT TERM GOALS: Target date: 07/27/2024    The patient will demonstrate knowledge of basic self care strategies and exercises to promote healing  Baseline: Goal status: INITIAL  2.  The patient will report a 30% improvement in pain levels with functional activities which are currently difficult including sitting, bending and sleeping Baseline:  Goal status: INITIAL  3.  The patient will have improved left hip  strength to 4/5 needed for single limb standing activities including curbs, steps, getting on/off the floor and kneeling  Baseline:  Goal status: INITIAL    LONG TERM GOALS: Target date: 08/24/2024   The patient will be independent in a safe self progression of a home exercise program to promote further recovery of function  Baseline:  Goal status: INITIAL  2.  The patient will report a 75% improvement in pain levels with functional activities which are currently difficult including sitting, bending and sleeping Baseline:  Goal status: INITIAL  3.  The patient will have improved trunk flexor and extensor muscle strength to at least 4+/5 needed for lifting medium weight objects such as grocery bags, laundry and luggage  Baseline:  Goal status: INITIAL  4.  The patient will have improved left hip strength to at least 4+/5 needed for standing, walking longer distances and recreational activities like dancing Baseline:  Goal status: INITIAL  5.  Modified Oswestry functional outcome measure score improved to  24 % indicating improved function with ADLS with less pain.  Baseline:  Goal status: INITIAL  PLAN:  PT FREQUENCY: 2x/week  PT DURATION: 8 weeks  PLANNED INTERVENTIONS: 97164- PT Re-evaluation, 97110-Therapeutic exercises, 97530- Therapeutic activity, 97112- Neuromuscular re-education, 97535- Self Care, 02859- Manual therapy, [phone]- Aquatic Therapy, [phone]- Electrical stimulation (unattended), [phone]- Electrical stimulation (manual), [id number]- Ultrasound, [id number]- Traction (mechanical), [id number]- Ionotophoresis 4mg /ml Dexamethasone, 79439 (1-2 muscles), 20561 (3+ muscles)- Dry Needling, Patient/Family education, Taping, Joint mobilization, Spinal manipulation, Spinal mobilization, Cryotherapy, and Moist heat.  PLAN FOR NEXT SESSION: core strength, hip strength, quadruped and standing; left gluteal strengthening; review HEP Burnard Joy, PT 07/12/24 8:57 AM  [facility]  [facility] [address], [address] [place], [place] [address] Phone # [phone] Fax [phone]

## 2024-07-14 ENCOUNTER — Ambulatory Visit: Attending: Neurological Surgery

## 2024-07-14 DIAGNOSIS — R293 Abnormal posture: Secondary | ICD-10-CM | POA: Diagnosis present

## 2024-07-14 DIAGNOSIS — M5416 Radiculopathy, lumbar region: Secondary | ICD-10-CM | POA: Diagnosis present

## 2024-07-14 DIAGNOSIS — R262 Difficulty in walking, not elsewhere classified: Secondary | ICD-10-CM | POA: Diagnosis present

## 2024-07-14 DIAGNOSIS — R252 Cramp and spasm: Secondary | ICD-10-CM | POA: Insufficient documentation

## 2024-07-14 DIAGNOSIS — M6281 Muscle weakness (generalized): Secondary | ICD-10-CM | POA: Insufficient documentation

## 2024-07-14 NOTE — Therapy (Signed)
 OUTPATIENT PHYSICAL THERAPY TREATMENT   Patient Name: Kara Velez MRN: [id number] DOB:[birth year], 33 y.o., female Today's Date: 07/14/2024  END OF SESSION:  PT End of Session - 07/14/24 0809     Visit Number 3    Date for Recertification  08/24/24    Authorization Type Medicaid [employer] no auth until 27 visits    Authorization - Visit Number 3    Authorization - Number of Visits 27    Progress Note Due on Visit 10    PT Start Time 0809    PT Stop Time 0902    PT Time Calculation (min) 53 min    Activity Tolerance Patient tolerated treatment well    Behavior During Therapy WFL for tasks assessed/performed           Past Medical History:  Diagnosis Date   Migraine headache    Nexplanon in place    Past Surgical History:  Procedure Laterality Date   extraction of wisdom teeth     WISDOM TOOTH EXTRACTION     three removed   Patient Active Problem List   Diagnosis Date Noted   Migraine without aura and without status migrainosus, not intractable 12/12/2016   Chronic bilateral low back pain without sciatica 12/12/2016   Nexplanon in place 04/11/2016   Frequent headaches 04/11/2016    PCP: Vicenta Maduro FNP  REFERRING PROVIDER: Joshua Alm Hamilton MD  REFERRING DIAG: M54.16 chronic radicular pain of lower back  Rationale for Evaluation and Treatment: Rehabilitation  THERAPY DIAG:  Back pain; weakness ONSET DATE: 2 years with exacerbation in last 6 months  SUBJECTIVE:                                                                                                                                                                                           SUBJECTIVE STATEMENT:  Patient reports she is actually doing a little better.  Pain reported at 3/10.    Never had PT before  PERTINENT HISTORY:  migraines  PAIN: 07/14/24  Are you having pain? Yes NPRS scale: 3/10 Pain location: lower back and left buttock; denies LE pain, numbness or tingling  symptoms Pain orientation: Bilateral  PAIN TYPE: aching and burning Pain description: constant  Aggravating factors: driving for long; now sitting at a desk all day; bending; standing; lifting; sleeping Relieving factors: shifting positions   PRECAUTIONS: None    WEIGHT BEARING RESTRICTIONS: No  FALLS:  Has patient fallen in last 6 months? 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  DIAGNOSTIC FINDINGS:  X-rays done with signs of scoliosis but unsure; waiting for MRI to be scheduled  PATIENT SURVEYS:  Modified Oswestry:  MODIFIED OSWESTRY DISABILITY SCALE  Date: 9/17 Score  Pain intensity 4 =  Pain medication provides me with little relief from pain.  2. Personal care (washing, dressing, etc.) 1 =  I can take care of myself normally, but it increases my pain.  3. Lifting 1 = I can lift heavy weights, but it causes increased pain.  4. Walking 1 = Pain prevents me from walking more than 1 mile.  5. Sitting 1 =  I can only sit in my favorite chair as long as I like.  6. Standing 1 =  I can stand as long as I want but, it increases my pain.  7. Sleeping 3 =  Even when I take pain medication, I sleep less than 4 hours.  8. Social Life 2 = Pain prevents me from participating in more energetic activities (eg. sports, dancing).  9. Traveling 2 =  My pain restricts my travel over 2 hours.  10. Employment/ Homemaking 1 = My normal homemaking/job activities increase my pain, but I can still perform all that is required of me  Total 34%   Interpretation of scores: Score Category Description  0-20% Minimal Disability The patient can cope with most living activities. Usually no treatment is indicated apart from advice on  lifting, sitting and exercise  21-40% Moderate Disability The patient experiences more pain and difficulty with sitting, lifting and standing. Travel and social life are more difficult and they may be disabled from work. Personal care, sexual activity and sleeping are not grossly affected, and the patient can usually be managed by conservative means  41-60% Severe Disability Pain remains the main problem in this group, but activities of daily living are affected. These patients require a detailed investigation  61-80% Crippled Back pain impinges on all aspects of the patient's life. Positive intervention is required  81-100% Bed-bound  These patients are either bed-bound or exaggerating their symptoms  [name], et al. Surgery versus conservative management of stable thoracolumbar fracture: the PRESTO feasibility RCT. [place] ([place]): [employer]; 2021 Nov. [facility] Technology Assessment, No. 25.62.) Appendix 3, Oswestry Disability Index category descriptors. Available from: Find[employer]el[employer].cz  Minimally Clinically Important Difference (MCID) = 12.8%  COGNITION: Overall cognitive status: Within functional limits for tasks assessed     MUSCLE LENGTH: Hamstrings: > 90 degrees bil   POSTURE: increased lumbar lordosis  PALPATION: Mild tenderness lumbar musculature, not tender over spinous processes  LUMBAR ROM:   AROM eval  Flexion 75 pt states she normally can put palms on the floor  Extension 30  Right lateral flexion 25  Left lateral flexion 25  Right rotation   Left rotation    (Blank rows = not tested)  TRUNK STRENGTH:  Decreased activation of transverse abdominus muscles; abdominals 4-/5; decreased activation of lumbar multifidi; trunk extensors 4-/5;   marked difficulty stabilizing with bird dogs and with excessive lumbar lordosis; difficulty stabilizing with left single leg standing   LOWER EXTREMITY ROM:   grossly  WFLS  LOWER EXTREMITY MMT:  able to rise from standard chair without UE use with ease  MMT Right eval Left eval  Hip flexion 5 5  Hip extension 5  4  Hip abduction 4+ 4-  Hip adduction    Hip internal rotation 5 5  Hip external rotation 5 4  Knee flexion    Knee extension    Ankle dorsiflexion    Ankle plantarflexion    Ankle inversion    Ankle eversion     (Blank rows = not tested)  LUMBAR SPECIAL TESTS:  Negative slump Negative SLR   GAIT:  Comments: WNLs  TREATMENT DATE:  07/14/24 NuStep: level 5x 5 minutes -PT present to discuss progress and status Standing hamstring stretch 3 x 30 each LE Standing quad/hip flexor stretch 3 x 30 each LE Seated piriformis stretch 3 x 30 each LE  Seated hamstring and figure 4- advised to use this during long stretches at desk Sit to stand with 5# kettlebell.  Core activation -parallel stance and staggered stance TA activation: supine with ball squeeze 5 hold x10  Sidelying clam 2x10 bil  Standing hip abduction and extension x10 each  07/12/24 NuStep: level 5x 6 minutes -PT present to discuss progress  Seated hamstring and figure 4- advised to use this during long stretches at desk Sit to stand with 5# kettlebell.  Core activation -parallel stance and staggered stance TA activation: supine with ball squeeze 5 hold x10  Sidelying clam 2x10 bil  Standing hip abduction and extension x10 each   9/17 evaluation    Use of lumbar roll when sitting at work; frequent standing/walking breaks Discussion of plan of care to focus on core stabilization  Discussed how to find transverse abdominus muscles and activation in supine, sidelying and standing                                                                                                                             PATIENT EDUCATION:  Education details: Educated patient on anatomy and physiology of current symptoms, prognosis, plan of care as well as initial self care  strategies to promote recovery Person educated: Patient Education method: Explanation Education comprehension: verbalized understanding  HOME EXERCISE PROGRAM: Access Code: [email or web address] URL: [email or web address] Date: 07/14/2024 Prepared by: Delon Haddock  Exercises - Standing Hamstring Stretch on Chair  - 1 x daily - 7 x weekly - 1 sets - 3 reps - 30 sec hold - Quadricep Stretch with Chair and Counter Support  - 1 x daily - 7 x weekly - 1 sets - 3 reps - 30 sec hold - Seated Hamstring Stretch  - 3 x daily - 7 x weekly - 1 sets - 3 reps - 20 hold - Seated Figure 4 Piriformis Stretch  - 3 x daily - 7 x weekly - 1 sets - 3 reps - 30 hold - Hooklying Transversus Abdominis Palpation  - 3 x daily - 7 x weekly - 1 sets - 10 reps - 5 hold - Seated Transversus Abdominis Bracing  - 1 x daily - 7 x weekly - 1 sets - 10 reps - 5 hold - Supine  Hip Adduction Isometric with Ball  - 1-2 x daily - 7 x weekly - 3 sets - 10 reps - Clamshell  - 1-2 x daily - 7 x weekly - 2 sets - 10 reps - Resisted Sit-to-Stand With Dumbbell at Chest  - 2 x daily - 7 x weekly - 2 sets - 10 reps - Supine 90/90 Alternating Heel Touches with Posterior Pelvic Tilt  - 1 x daily - 7 x weekly - 1 sets - 20 reps - Supine Dead Bug with Leg Extension  - 1 x daily - 7 x weekly - 1 sets - 20 reps  ASSESSMENT:  CLINICAL IMPRESSION: Kara Velez is progressing appropriately.  She was able to tolerate addition of some mod level core exercises but does struggle to maintain neutral pelvis.  She is compliant and well motivated.  She should continue to do well.   Patient will benefit from continuing skilled PT to address the below impairments and improve overall function.    OBJECTIVE IMPAIRMENTS: decreased activity tolerance, decreased mobility, decreased strength, impaired perceived functional ability, and pain.   ACTIVITY LIMITATIONS: carrying, lifting, bending, sitting, sleeping, and locomotion level  PARTICIPATION  LIMITATIONS: meal prep, cleaning, laundry, driving, shopping, community activity, and occupation  PERSONAL FACTORS: Time since onset of injury/illness/exacerbation are also affecting patient's functional outcome.   REHAB POTENTIAL: Good  CLINICAL DECISION MAKING: Stable/uncomplicated  EVALUATION COMPLEXITY: Low   GOALS: Goals reviewed with patient? Yes  SHORT TERM GOALS: Target date: 07/27/2024    The patient will demonstrate knowledge of basic self care strategies and exercises to promote healing  Baseline: Goal status: INITIAL  2.  The patient will report a 30% improvement in pain levels with functional activities which are currently difficult including sitting, bending and sleeping Baseline:  Goal status: INITIAL  3.  The patient will have improved left hip strength to 4/5 needed for single limb standing activities including curbs, steps, getting on/off the floor and kneeling  Baseline:  Goal status: INITIAL    LONG TERM GOALS: Target date: 08/24/2024   The patient will be independent in a safe self progression of a home exercise program to promote further recovery of function  Baseline:  Goal status: INITIAL  2.  The patient will report a 75% improvement in pain levels with functional activities which are currently difficult including sitting, bending and sleeping Baseline:  Goal status: INITIAL  3.  The patient will have improved trunk flexor and extensor muscle strength to at least 4+/5 needed for lifting medium weight objects such as grocery bags, laundry and luggage  Baseline:  Goal status: INITIAL  4.  The patient will have improved left hip strength to at least 4+/5 needed for standing, walking longer distances and recreational activities like dancing Baseline:  Goal status: INITIAL  5.  Modified Oswestry functional outcome measure score improved to  24 % indicating improved function with ADLS with less pain.  Baseline:  Goal status:  INITIAL  PLAN:  PT FREQUENCY: 2x/week  PT DURATION: 8 weeks  PLANNED INTERVENTIONS: 97164- PT Re-evaluation, 97110-Therapeutic exercises, 97530- Therapeutic activity, 97112- Neuromuscular re-education, 97535- Self Care, 02859- Manual therapy, [id number]- Aquatic Therapy, [phone]- Electrical stimulation (unattended), [id number]- Electrical stimulation (manual), [id number]- Ultrasound, [id number]- Traction (mechanical), [id number]- Ionotophoresis 4mg /ml Dexamethasone, 79439 (1-2 muscles), 20561 (3+ muscles)- Dry Needling, Patient/Family education, Taping, Joint mobilization, Spinal manipulation, Spinal mobilization, Cryotherapy, and Moist heat.  PLAN FOR NEXT SESSION: Progress core strength, hip strength, quadruped and standing; left gluteal strengthening; review HEP  Starlett Pehrson B. Edelyn Heidel,  PT 07/14/24 8:57 AM [facility] [address], [address] [place], [place] [address] Phone # [phone] Fax [phone]

## 2024-07-15 ENCOUNTER — Encounter: Admitting: Physical Therapy

## 2024-07-19 ENCOUNTER — Ambulatory Visit: Admitting: Physical Therapy

## 2024-07-19 DIAGNOSIS — M5416 Radiculopathy, lumbar region: Secondary | ICD-10-CM | POA: Diagnosis not present

## 2024-07-19 DIAGNOSIS — M6281 Muscle weakness (generalized): Secondary | ICD-10-CM

## 2024-07-19 NOTE — Therapy (Signed)
 OUTPATIENT PHYSICAL THERAPY TREATMENT   Patient Name: Kara Velez MRN: [id number] DOB:06-23-1991, 33 y.o., female Today's Date: 07/19/2024  END OF SESSION:  PT End of Session - 07/19/24 0755     Visit Number 4    Date for Recertification  08/24/24    Authorization Type Medicaid [employer] no auth until 27 visits    Authorization - Visit Number 4    Authorization - Number of Visits 27    PT Start Time 0756    PT Stop Time 0835    PT Time Calculation (min) 39 min    Activity Tolerance Patient tolerated treatment well           Past Medical History:  Diagnosis Date   Migraine headache    Nexplanon in place    Past Surgical History:  Procedure Laterality Date   extraction of wisdom teeth     WISDOM TOOTH EXTRACTION     three removed   Patient Active Problem List   Diagnosis Date Noted   Migraine without aura and without status migrainosus, not intractable 12/12/2016   Chronic bilateral low back pain without sciatica 12/12/2016   Nexplanon in place 04/11/2016   Frequent headaches 04/11/2016    PCP: Vicenta Maduro FNP  REFERRING PROVIDER: Joshua Alm Hamilton MD  REFERRING DIAG: M54.16 chronic radicular pain of lower back  Rationale for Evaluation and Treatment: Rehabilitation  THERAPY DIAG:  Back pain; weakness ONSET DATE: 2 years with exacerbation in last 6 months  SUBJECTIVE:                                                                                                                                                                                           SUBJECTIVE STATEMENT: Woke up a few times with pain last night. Buttock pain currently.  Did fine after visit.  My core was sore.  It showed me I needed it.   Never had PT before; hypermobility tendencies  PERTINENT HISTORY:  Migraines  PAIN: 07/19/24  Are you having pain? Yes NPRS scale: 3/10 Pain location: lower back and left buttock Pain orientation: Bilateral  PAIN TYPE: aching and  burning Pain description: constant  Aggravating factors: driving for long; now sitting at a desk all day; bending; standing; lifting; sleeping Relieving factors: shifting positions   PRECAUTIONS: None    WEIGHT BEARING RESTRICTIONS: No  FALLS:  Has patient fallen in last 6 months? 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  DIAGNOSTIC FINDINGS:  X-rays done with signs of scoliosis but unsure; waiting for MRI to be scheduled  PATIENT SURVEYS:  Modified Oswestry:  MODIFIED OSWESTRY DISABILITY SCALE  Date: 9/17 Score  Pain intensity 4 =  Pain medication provides me with little relief from pain.  2. Personal care (washing, dressing, etc.) 1 =  I can take care of myself normally, but it increases my pain.  3. Lifting 1 = I can lift heavy weights, but it causes increased pain.  4. Walking 1 = Pain prevents me from walking more than 1 mile.  5. Sitting 1 =  I can only sit in my favorite chair as long as I like.  6. Standing 1 =  I can stand as long as I want but, it increases my pain.  7. Sleeping 3 =  Even when I take pain medication, I sleep less than 4 hours.  8. Social Life 2 = Pain prevents me from participating in more energetic activities (eg. sports, dancing).  9. Traveling 2 =  My pain restricts my travel over 2 hours.  10. Employment/ Homemaking 1 = My normal homemaking/job activities increase my pain, but I can still perform all that is required of me  Total 34%   Interpretation of scores: Score Category Description  0-20% Minimal Disability The patient can cope with most living activities. Usually no treatment is indicated apart from advice on lifting, sitting and exercise  21-40% Moderate Disability The  patient experiences more pain and difficulty with sitting, lifting and standing. Travel and social life are more difficult and they may be disabled from work. Personal care, sexual activity and sleeping are not grossly affected, and the patient can usually be managed by conservative means  41-60% Severe Disability Pain remains the main problem in this group, but activities of daily living are affected. These patients require a detailed investigation  61-80% Crippled Back pain impinges on all aspects of the patient's life. Positive intervention is required  81-100% Bed-bound  These patients are either bed-bound or exaggerating their symptoms  [name], et al. Surgery versus conservative management of stable thoracolumbar fracture: the PRESTO feasibility RCT. [place] ([place]): [employer]; 2021 Nov. [facility] Technology Assessment, No. 25.62.) Appendix 3, Oswestry Disability Index category descriptors. Available from: Find[employer]el[employer]cz  Minimally Clinically Important Difference (MCID) = 12.8%  COGNITION: Overall cognitive status: Within functional limits for tasks assessed     MUSCLE LENGTH: Hamstrings: > 90 degrees bil   POSTURE: increased lumbar lordosis  PALPATION: Mild tenderness lumbar musculature, not tender over spinous processes  LUMBAR ROM:   AROM eval  Flexion 75 pt states she normally can put palms on the floor  Extension 30  Right lateral flexion 25  Left lateral flexion 25  Right rotation   Left rotation    (Blank rows = not tested)  TRUNK STRENGTH:  Decreased activation of transverse abdominus muscles; abdominals 4-/5; decreased activation of lumbar multifidi; trunk extensors 4-/5;   marked difficulty stabilizing with bird dogs and with excessive lumbar lordosis; difficulty stabilizing with left single leg standing   LOWER EXTREMITY ROM:   grossly WFLS  LOWER EXTREMITY MMT:  able to rise from standard chair  without UE use with ease  MMT Right eval Left eval  Hip flexion 5 5  Hip extension 5 4  Hip abduction 4+ 4-  Hip adduction  Hip internal rotation 5 5  Hip external rotation 5 4  Knee flexion    Knee extension    Ankle dorsiflexion    Ankle plantarflexion    Ankle inversion    Ankle eversion     (Blank rows = not tested)  LUMBAR SPECIAL TESTS:  Negative slump Negative SLR   GAIT:  Comments: WNLs  TREATMENT DATE:  07/19/24 NuStep: level 5x 5 minutes -PT present to discuss progress and status Supine transverse abdominus draw in 5 sec hold 5x Supine isometric hand to opposite knee push 5 sec hold 5x Supine sequential bent knee lift and lower 6x (challenging) Supine: green band with handles Ues hold at 90 degrees with marching 7x; holding Les 90/90 statically with single arm extensions 7x Bird dogs: Ues only, LE only, alternating UE/LE 5x each (Added to HEP- see below) Squats with 5# kettlebell 10x   Standing green band rows 15x (Added to HEP- see below) Standing green band shoulder extension 15x (Added to HEP- see below) Standing core strengthening series holding pair of 5 pound dumbbells while marching sets of 7 reps each:  1) Farmers hold; 2) single at the shoulder hold; 3) single overhead press hold    07/14/24 NuStep: level 5x 5 minutes -PT present to discuss progress and status Standing hamstring stretch 3 x 30 each LE Standing quad/hip flexor stretch 3 x 30 each LE Seated piriformis stretch 3 x 30 each LE  Seated hamstring and figure 4- advised to use this during long stretches at desk Sit to stand with 5# kettlebell.  Core activation -parallel stance and staggered stance TA activation: supine with ball squeeze 5 hold x10  Sidelying clam 2x10 bil  Standing hip abduction and extension x10 each  07/12/24 NuStep: level 5x 6 minutes -PT present to discuss progress  Seated hamstring and figure 4- advised to use this during long stretches at desk Sit to stand  with 5# kettlebell.  Core activation -parallel stance and staggered stance TA activation: supine with ball squeeze 5 hold x10  Sidelying clam 2x10 bil  Standing hip abduction and extension x10 each   9/17 evaluation    Use of lumbar roll when sitting at work; frequent standing/walking breaks Discussion of plan of care to focus on core stabilization  Discussed how to find transverse abdominus muscles and activation in supine, sidelying and standing                                                                                                                             PATIENT EDUCATION:  Education details: Educated patient on anatomy and physiology of current symptoms, prognosis, plan of care as well as initial self care strategies to promote recovery Person educated: Patient Education method: Explanation Education comprehension: verbalized understanding  HOME EXERCISE PROGRAM: Access Code: [email or web address] URL: [email or web address] Date: 07/19/2024 Prepared by: Glade Pesa  Exercises - Standing Hamstring Stretch on Chair  - 1 x daily - 7 x weekly -  1 sets - 3 reps - 30 sec hold - Theatre manager with Chair and Counter Support  - 1 x daily - 7 x weekly - 1 sets - 3 reps - 30 sec hold - Seated Hamstring Stretch  - 3 x daily - 7 x weekly - 1 sets - 3 reps - 20 hold - Seated Figure 4 Piriformis Stretch  - 3 x daily - 7 x weekly - 1 sets - 3 reps - 30 hold - Hooklying Transversus Abdominis Palpation  - 3 x daily - 7 x weekly - 1 sets - 10 reps - 5 hold - Seated Transversus Abdominis Bracing  - 1 x daily - 7 x weekly - 1 sets - 10 reps - 5 hold - Supine Hip Adduction Isometric with Ball  - 1-2 x daily - 7 x weekly - 3 sets - 10 reps - Clamshell  - 1-2 x daily - 7 x weekly - 2 sets - 10 reps - Resisted Sit-to-Stand With Dumbbell at Chest  - 2 x daily - 7 x weekly - 2 sets - 10 reps - Supine 90/90 Alternating Heel Touches with Posterior Pelvic Tilt  - 1 x daily - 7 x weekly -  1 sets - 20 reps - Supine Dead Bug with Leg Extension  - 1 x daily - 7 x weekly - 1 sets - 20 reps - Bird Dog  - 1 x daily - 7 x weekly - 1 sets - 10 reps - Standing Row with Anchored Resistance  - 1 x daily - 7 x weekly - 2 sets - 10 reps - Shoulder extension with resistance - Neutral  - 1 x daily - 7 x weekly - 2 sets - 10 reps  ASSESSMENT:  CLINICAL IMPRESSION: Verbal and tactile cues for proper recruitment of transverse abdominus and lumbar multifidi muscles without compensatory strategy of holding breath. In standing tends to hyperextend but able to correct with tactile cues.  Therapist progressing and updating HEP for increased intensity and challenge level for further strengthening and stabilization.   May need a slower progression as patient reports she often has muscle soreness after exercise in general.     OBJECTIVE IMPAIRMENTS: decreased activity tolerance, decreased mobility, decreased strength, impaired perceived functional ability, and pain.   ACTIVITY LIMITATIONS: carrying, lifting, bending, sitting, sleeping, and locomotion level  PARTICIPATION LIMITATIONS: meal prep, cleaning, laundry, driving, shopping, community activity, and occupation  PERSONAL FACTORS: Time since onset of injury/illness/exacerbation are also affecting patient's functional outcome.   REHAB POTENTIAL: Good  CLINICAL DECISION MAKING: Stable/uncomplicated  EVALUATION COMPLEXITY: Low   GOALS: Goals reviewed with patient? Yes  SHORT TERM GOALS: Target date: 07/27/2024    The patient will demonstrate knowledge of basic self care strategies and exercises to promote healing  Baseline: Goal status: INITIAL  2.  The patient will report a 30% improvement in pain levels with functional activities which are currently difficult including sitting, bending and sleeping Baseline:  Goal status: INITIAL  3.  The patient will have improved left hip strength to 4/5 needed for single limb standing activities  including curbs, steps, getting on/off the floor and kneeling  Baseline:  Goal status: INITIAL    LONG TERM GOALS: Target date: 08/24/2024   The patient will be independent in a safe self progression of a home exercise program to promote further recovery of function  Baseline:  Goal status: INITIAL  2.  The patient will report a 75% improvement in pain levels with functional activities  which are currently difficult including sitting, bending and sleeping Baseline:  Goal status: INITIAL  3.  The patient will have improved trunk flexor and extensor muscle strength to at least 4+/5 needed for lifting medium weight objects such as grocery bags, laundry and luggage  Baseline:  Goal status: INITIAL  4.  The patient will have improved left hip strength to at least 4+/5 needed for standing, walking longer distances and recreational activities like dancing Baseline:  Goal status: INITIAL  5.  Modified Oswestry functional outcome measure score improved to  24 % indicating improved function with ADLS with less pain.  Baseline:  Goal status: INITIAL  PLAN:  PT FREQUENCY: 2x/week  PT DURATION: 8 weeks  PLANNED INTERVENTIONS: 97164- PT Re-evaluation, 97110-Therapeutic exercises, 97530- Therapeutic activity, 97112- Neuromuscular re-education, 97535- Self Care, 02859- Manual therapy, [phone]- Aquatic Therapy, [phone]- Electrical stimulation (unattended), [phone]- Electrical stimulation (manual), [id number]- Ultrasound, [id number]- Traction (mechanical), [id number]- Ionotophoresis 4mg /ml Dexamethasone, 79439 (1-2 muscles), 20561 (3+ muscles)- Dry Needling, Patient/Family education, Taping, Joint mobilization, Spinal manipulation, Spinal mobilization, Cryotherapy, and Moist heat.  PLAN FOR NEXT SESSION: Progress core strength, hip strength, quadruped and standing; left gluteal strengthening; review HEP; hypermobility tendencies   Glade Pesa, PT 07/19/24 8:42 AM Phone: [phone] Fax:  [phone]  [facility] [address] [place], [place] [address] Phone # [phone] Fax [phone]

## 2024-07-22 ENCOUNTER — Telehealth: Payer: Self-pay | Admitting: Physical Therapy

## 2024-07-22 ENCOUNTER — Ambulatory Visit: Admitting: Physical Therapy

## 2024-07-22 NOTE — Telephone Encounter (Signed)
 Left patient a voicemail about missed appointment. Reminded patient of next scheduled appointment and left a call back number to the clinic.   Kristeen Sar, PT 07/22/24 8:26 AM

## 2024-07-26 ENCOUNTER — Encounter: Admitting: Physical Therapy

## 2024-07-27 ENCOUNTER — Ambulatory Visit: Admitting: Physical Therapy

## 2024-07-27 DIAGNOSIS — M6281 Muscle weakness (generalized): Secondary | ICD-10-CM

## 2024-07-27 DIAGNOSIS — M5416 Radiculopathy, lumbar region: Secondary | ICD-10-CM

## 2024-07-27 NOTE — Therapy (Signed)
 OUTPATIENT PHYSICAL THERAPY TREATMENT   Patient Name: Kara Velez MRN: [id number] DOB:1990-12-09, 33 y.o., female Today's Date: 07/27/2024  END OF SESSION:  PT End of Session - 07/27/24 0847     Visit Number 5    Date for Recertification  08/24/24    Authorization Type Medicaid [employer] no auth until 27 visits    Authorization - Visit Number 5    Authorization - Number of Visits 27    PT Start Time 0848    PT Stop Time 0929    PT Time Calculation (min) 41 min    Activity Tolerance Patient tolerated treatment well           Past Medical History:  Diagnosis Date   Migraine headache    Nexplanon in place    Past Surgical History:  Procedure Laterality Date   extraction of wisdom teeth     WISDOM TOOTH EXTRACTION     three removed   Patient Active Problem List   Diagnosis Date Noted   Migraine without aura and without status migrainosus, not intractable 12/12/2016   Chronic bilateral low back pain without sciatica 12/12/2016   Nexplanon in place 04/11/2016   Frequent headaches 04/11/2016    PCP: Vicenta Maduro FNP  REFERRING PROVIDER: Joshua Alm Hamilton MD  REFERRING DIAG: M54.16 chronic radicular pain of lower back  Rationale for Evaluation and Treatment: Rehabilitation  THERAPY DIAG:  Back pain; weakness ONSET DATE: 2 years with exacerbation in last 6 months  SUBJECTIVE:                                                                                                                                                                                           SUBJECTIVE STATEMENT: Tailbone pain with sitting on hard surface.  I feel good today.  Had some pain the other day with driving a long time.    Never had PT before; hypermobility tendencies  PERTINENT HISTORY:  Migraines  PAIN: 07/27/24  Are you having pain? Yes NPRS scale: 1/10 Pain location: lower back and left buttock Pain orientation: Bilateral  PAIN TYPE: aching and burning Pain  description: constant  Aggravating factors: driving for long; now sitting at a desk all day; bending; standing; lifting; sleeping Relieving factors: shifting positions   PRECAUTIONS: None    WEIGHT BEARING RESTRICTIONS: No  FALLS:  Has patient fallen in last 6 months? 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  DIAGNOSTIC FINDINGS:  X-rays done with signs of scoliosis but unsure; waiting for MRI to be scheduled  PATIENT SURVEYS:  Modified Oswestry:  MODIFIED OSWESTRY DISABILITY SCALE  Date: 9/17 Score  Pain intensity 4 =  Pain medication provides me with little relief from pain.  2. Personal care (washing, dressing, etc.) 1 =  I can take care of myself normally, but it increases my pain.  3. Lifting 1 = I can lift heavy weights, but it causes increased pain.  4. Walking 1 = Pain prevents me from walking more than 1 mile.  5. Sitting 1 =  I can only sit in my favorite chair as long as I like.  6. Standing 1 =  I can stand as long as I want but, it increases my pain.  7. Sleeping 3 =  Even when I take pain medication, I sleep less than 4 hours.  8. Social Life 2 = Pain prevents me from participating in more energetic activities (eg. sports, dancing).  9. Traveling 2 =  My pain restricts my travel over 2 hours.  10. Employment/ Homemaking 1 = My normal homemaking/job activities increase my pain, but I can still perform all that is required of me  Total 34%   Interpretation of scores: Score Category Description  0-20% Minimal Disability The patient can cope with most living activities. Usually no treatment is indicated apart from advice on lifting, sitting and exercise  21-40% Moderate Disability The patient experiences  more pain and difficulty with sitting, lifting and standing. Travel and social life are more difficult and they may be disabled from work. Personal care, sexual activity and sleeping are not grossly affected, and the patient can usually be managed by conservative means  41-60% Severe Disability Pain remains the main problem in this group, but activities of daily living are affected. These patients require a detailed investigation  61-80% Crippled Back pain impinges on all aspects of the patient's life. Positive intervention is required  81-100% Bed-bound  These patients are either bed-bound or exaggerating their symptoms  [name], et al. Surgery versus conservative management of stable thoracolumbar fracture: the PRESTO feasibility RCT. [place] ([place]): [employer]; 2021 Nov. [facility] Technology Assessment, No. 25.62.) Appendix 3, Oswestry Disability Index category descriptors. Available from: [employer]cz  Minimally Clinically Important Difference (MCID) = 12.8%  COGNITION: Overall cognitive status: Within functional limits for tasks assessed     MUSCLE LENGTH: Hamstrings: > 90 degrees bil   POSTURE: increased lumbar lordosis  PALPATION: Mild tenderness lumbar musculature, not tender over spinous processes  LUMBAR ROM:   AROM eval  Flexion 75 pt states she normally can put palms on the floor  Extension 30  Right lateral flexion 25  Left lateral flexion 25  Right rotation   Left rotation    (Blank rows = not tested)  TRUNK STRENGTH:  Decreased activation of transverse abdominus muscles; abdominals 4-/5; decreased activation of lumbar multifidi; trunk extensors 4-/5;   marked difficulty stabilizing with bird dogs and with excessive lumbar lordosis; difficulty stabilizing with left single leg standing   LOWER EXTREMITY ROM:   grossly WFLS  LOWER EXTREMITY MMT:  able to rise from standard chair without UE use with  ease  MMT Right eval Left eval 10/15  Hip flexion 5 5 5   Hip extension 5 4 L 4+  Hip abduction 4+ 4- R 4+/L 4  Hip adduction  Hip internal rotation 5 5 5   Hip external rotation 5 4 L 4+  Knee flexion     Knee extension     Ankle dorsiflexion     Ankle plantarflexion     Ankle inversion     Ankle eversion      (Blank rows = not tested)  LUMBAR SPECIAL TESTS:  Negative slump Negative SLR   GAIT:  Comments: WNLs  TREATMENT DATE:  07/27/24 NuStep: level 3x 5 minutes -PT present to discuss progress and status and plan for session Supine transverse abdominus draw in 5 sec hold 5x cues to avoid pelvic tilt Supine isometric hand to opposite knee push 5 sec hold 5x Supine sequential bent knee lift and lower 10x cues for coordination of breathing Bird dogs: Ues only, LE only, alternating UE/LE 5x each  Squats with 5# kettlebell with overhead press 10x   Standing green band shoulder extension 15x  Standing green band shoulder extension with opposite hip flexion 5x, and same side hip flexion Standing lat bar 30# 10x  Leg press seat 6 70# 20x cues for knee alignment  Standing Pallof series red band: press out, press up, holding arms at 90 degrees with marching and then retro step 5x  2 rounds     07/19/24 NuStep: level 5x 5 minutes -PT present to discuss progress and status Supine transverse abdominus draw in 5 sec hold 5x Supine isometric hand to opposite knee push 5 sec hold 5x Supine sequential bent knee lift and lower 6x (challenging) Supine: green band with handles Ues hold at 90 degrees with marching 7x; holding Les 90/90 statically with single arm extensions 7x Bird dogs: Ues only, LE only, alternating UE/LE 5x each (Added to HEP- see below) Squats with 5# kettlebell 10x   Standing green band rows 15x (Added to HEP- see below) Standing green band shoulder extension 15x (Added to HEP- see below) Standing core strengthening series holding pair of 5 pound dumbbells while  marching sets of 7 reps each:  1) Farmers hold; 2) single at the shoulder hold; 3) single overhead press hold    07/14/24 NuStep: level 5x 5 minutes -PT present to discuss progress and status Standing hamstring stretch 3 x 30 each LE Standing quad/hip flexor stretch 3 x 30 each LE Seated piriformis stretch 3 x 30 each LE  Seated hamstring and figure 4- advised to use this during long stretches at desk Sit to stand with 5# kettlebell.  Core activation -parallel stance and staggered stance TA activation: supine with ball squeeze 5 hold x10  Sidelying clam 2x10 bil  Standing hip abduction and extension x10 each  07/12/24 NuStep: level 5x 6 minutes -PT present to discuss progress  Seated hamstring and figure 4- advised to use this during long stretches at desk Sit to stand with 5# kettlebell.  Core activation -parallel stance and staggered stance TA activation: supine with ball squeeze 5 hold x10  Sidelying clam 2x10 bil  Standing hip abduction and extension x10 each    PATIENT EDUCATION:  Education details: Educated patient on anatomy and physiology of current symptoms, prognosis, plan of care as well as initial self care strategies to promote recovery Person educated: Patient Education method: Explanation Education comprehension: verbalized understanding  HOME EXERCISE PROGRAM: Access Code: [email or web address] URL: [email or web address] Date: 07/19/2024 Prepared by: Glade Pesa  Exercises - Standing Hamstring Stretch on Chair  - 1 x daily - 7 x weekly - 1 sets - 3 reps - 30 sec hold - Quadricep Stretch  with Chair and Counter Support  - 1 x daily - 7 x weekly - 1 sets - 3 reps - 30 sec hold - Seated Hamstring Stretch  - 3 x daily - 7 x weekly - 1 sets - 3 reps - 20 hold - Seated Figure 4 Piriformis Stretch  - 3 x daily - 7 x weekly - 1 sets - 3 reps - 30 hold - Hooklying Transversus Abdominis Palpation  - 3 x daily - 7 x weekly - 1 sets - 10 reps - 5 hold - Seated  Transversus Abdominis Bracing  - 1 x daily - 7 x weekly - 1 sets - 10 reps - 5 hold - Supine Hip Adduction Isometric with Ball  - 1-2 x daily - 7 x weekly - 3 sets - 10 reps - Clamshell  - 1-2 x daily - 7 x weekly - 2 sets - 10 reps - Resisted Sit-to-Stand With Dumbbell at Chest  - 2 x daily - 7 x weekly - 2 sets - 10 reps - Supine 90/90 Alternating Heel Touches with Posterior Pelvic Tilt  - 1 x daily - 7 x weekly - 1 sets - 20 reps - Supine Dead Bug with Leg Extension  - 1 x daily - 7 x weekly - 1 sets - 20 reps - Bird Dog  - 1 x daily - 7 x weekly - 1 sets - 10 reps - Standing Row with Anchored Resistance  - 1 x daily - 7 x weekly - 2 sets - 10 reps - Shoulder extension with resistance - Neutral  - 1 x daily - 7 x weekly - 2 sets - 10 reps  ASSESSMENT:  CLINICAL IMPRESSION: Verbal and tactile cues for transverse abdominal activation and patellofemoral alignment (avoidance of knee hyperextension and genu valgus) and to decrease compensatory strategies  during functional movements.  Also cues for exhalation on exertion to enhance core muscle activation. During and end of session she reports minimal to no pain and states, I feel good.  Progressing with rehab goals.      OBJECTIVE IMPAIRMENTS: decreased activity tolerance, decreased mobility, decreased strength, impaired perceived functional ability, and pain.   ACTIVITY LIMITATIONS: carrying, lifting, bending, sitting, sleeping, and locomotion level  PARTICIPATION LIMITATIONS: meal prep, cleaning, laundry, driving, shopping, community activity, and occupation  PERSONAL FACTORS: Time since onset of injury/illness/exacerbation are also affecting patient's functional outcome.   REHAB POTENTIAL: Good  CLINICAL DECISION MAKING: Stable/uncomplicated  EVALUATION COMPLEXITY: Low   GOALS: Goals reviewed with patient? Yes  SHORT TERM GOALS: Target date: 07/27/2024    The patient will demonstrate knowledge of basic self care strategies  and exercises to promote healing  Baseline: Goal status:met 10/15  2.  The patient will report a 30% improvement in pain levels with functional activities which are currently difficult including sitting, bending and sleeping Baseline:  Goal status: INITIAL  3.  The patient will have improved left hip strength to 4/5 needed for single limb standing activities including curbs, steps, getting on/off the floor and kneeling  Baseline:  Goal status: met 10/15   LONG TERM GOALS: Target date: 08/24/2024   The patient will be independent in a safe self progression of a home exercise program to promote further recovery of function  Baseline:  Goal status: INITIAL  2.  The patient will report a 75% improvement in pain levels with functional activities which are currently difficult including sitting, bending and sleeping Baseline:  Goal status: INITIAL  3.  The patient  will have improved trunk flexor and extensor muscle strength to at least 4+/5 needed for lifting medium weight objects such as grocery bags, laundry and luggage  Baseline:  Goal status: INITIAL  4.  The patient will have improved left hip strength to at least 4+/5 needed for standing, walking longer distances and recreational activities like dancing Baseline:  Goal status: INITIAL  5.  Modified Oswestry functional outcome measure score improved to  24 % indicating improved function with ADLS with less pain.  Baseline:  Goal status: INITIAL  PLAN:  PT FREQUENCY: 2x/week  PT DURATION: 8 weeks  PLANNED INTERVENTIONS: 97164- PT Re-evaluation, 97110-Therapeutic exercises, 97530- Therapeutic activity, 97112- Neuromuscular re-education, 97535- Self Care, 02859- Manual therapy, [phone]- Aquatic Therapy, [phone]- Electrical stimulation (unattended), [phone]- Electrical stimulation (manual), [id number]- Ultrasound, [id number]- Traction (mechanical), [id number]- Ionotophoresis 4mg /ml Dexamethasone, 79439 (1-2 muscles), 20561 (3+ muscles)- Dry Needling,  Patient/Family education, Taping, Joint mobilization, Spinal manipulation, Spinal mobilization, Cryotherapy, and Moist heat.  PLAN FOR NEXT SESSION:check % improvement for STG; Progress core strength, hip strength, quadruped and standing; left gluteal strengthening; review HEP; hypermobility tendencies  Glade Pesa, PT 07/27/24 10:01 AM Phone: [phone] Fax: [phone]   [facility] [address] [place], [place] [address] Phone # [phone] Fax [phone]

## 2024-07-28 ENCOUNTER — Encounter: Admitting: Physical Therapy

## 2024-08-02 ENCOUNTER — Ambulatory Visit

## 2024-08-02 DIAGNOSIS — M5416 Radiculopathy, lumbar region: Secondary | ICD-10-CM | POA: Diagnosis not present

## 2024-08-02 DIAGNOSIS — R252 Cramp and spasm: Secondary | ICD-10-CM

## 2024-08-02 DIAGNOSIS — M6281 Muscle weakness (generalized): Secondary | ICD-10-CM

## 2024-08-02 DIAGNOSIS — R262 Difficulty in walking, not elsewhere classified: Secondary | ICD-10-CM

## 2024-08-02 DIAGNOSIS — R293 Abnormal posture: Secondary | ICD-10-CM

## 2024-08-02 NOTE — Therapy (Signed)
 OUTPATIENT PHYSICAL THERAPY TREATMENT   Patient Name: Kara Velez MRN: [id number] DOB:November 07, 1990, 33 y.o., female Today's Date: 08/02/2024  END OF SESSION:  PT End of Session - 08/02/24 0927     Visit Number 6    Date for Recertification  08/24/24    Authorization Type Medicaid [employer] no auth until 27 visits    Authorization - Visit Number 6    Authorization - Number of Visits 27    Progress Note Due on Visit 10    PT Start Time 0845    PT Stop Time 0929    PT Time Calculation (min) 44 min    Activity Tolerance Patient tolerated treatment well    Behavior During Therapy WFL for tasks assessed/performed            Past Medical History:  Diagnosis Date   Migraine headache    Nexplanon in place    Past Surgical History:  Procedure Laterality Date   extraction of wisdom teeth     WISDOM TOOTH EXTRACTION     three removed   Patient Active Problem List   Diagnosis Date Noted   Migraine without aura and without status migrainosus, not intractable 12/12/2016   Chronic bilateral low back pain without sciatica 12/12/2016   Nexplanon in place 04/11/2016   Frequent headaches 04/11/2016    PCP: Vicenta Maduro FNP  REFERRING PROVIDER: Joshua Alm Hamilton MD  REFERRING DIAG: M54.16 chronic radicular pain of lower back  Rationale for Evaluation and Treatment: Rehabilitation  THERAPY DIAG:  Back pain; weakness ONSET DATE: 2 years with exacerbation in last 6 months  SUBJECTIVE:                                                                                                                                                                                           SUBJECTIVE STATEMENT: I am feeling good.  I haven't had a bad night in a few weeks.  I am 70% better overall.   Never had PT before; hypermobility tendencies  PERTINENT HISTORY:  Migraines  PAIN: 08/02/24  Are you having pain? Yes NPRS scale: 0/10 Pain location: lower back and left buttock Pain  orientation: Bilateral  PAIN TYPE: aching and burning Pain description: constant  Aggravating factors: driving for long; now sitting at a desk all day; bending; standing; lifting; sleeping Relieving factors: shifting positions   PRECAUTIONS: None    WEIGHT BEARING RESTRICTIONS: No  FALLS:  Has patient fallen in last 6 months? 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  DIAGNOSTIC FINDINGS:  X-rays done with signs of scoliosis but unsure; waiting for MRI to be scheduled  PATIENT SURVEYS:  Modified Oswestry:  MODIFIED OSWESTRY DISABILITY SCALE  Date: 9/17 Score  Pain intensity 4 =  Pain medication provides me with little relief from pain.  2. Personal care (washing, dressing, etc.) 1 =  I can take care of myself normally, but it increases my pain.  3. Lifting 1 = I can lift heavy weights, but it causes increased pain.  4. Walking 1 = Pain prevents me from walking more than 1 mile.  5. Sitting 1 =  I can only sit in my favorite chair as long as I like.  6. Standing 1 =  I can stand as long as I want but, it increases my pain.  7. Sleeping 3 =  Even when I take pain medication, I sleep less than 4 hours.  8. Social Life 2 = Pain prevents me from participating in more energetic activities (eg. sports, dancing).  9. Traveling 2 =  My pain restricts my travel over 2 hours.  10. Employment/ Homemaking 1 = My normal homemaking/job activities increase my pain, but I can still perform all that is required of me  Total 34%   Interpretation of scores: Score Category Description  0-20% Minimal Disability The patient can cope with most living activities. Usually no treatment is indicated apart from advice on lifting, sitting and  exercise  21-40% Moderate Disability The patient experiences more pain and difficulty with sitting, lifting and standing. Travel and social life are more difficult and they may be disabled from work. Personal care, sexual activity and sleeping are not grossly affected, and the patient can usually be managed by conservative means  41-60% Severe Disability Pain remains the main problem in this group, but activities of daily living are affected. These patients require a detailed investigation  61-80% Crippled Back pain impinges on all aspects of the patient's life. Positive intervention is required  81-100% Bed-bound  These patients are either bed-bound or exaggerating their symptoms  [name], et al. Surgery versus conservative management of stable thoracolumbar fracture: the PRESTO feasibility RCT. [place] ([place]): [employer]; 2021 Nov. [facility] Technology Assessment, No. 25.62.) Appendix 3, Oswestry Disability Index category descriptors. Available from: FindJewel[employer].cz  Minimally Clinically Important Difference (MCID) = 12.8%  08/02/24: Modified Oswestry 5/10=10% disability   COGNITION: Overall cognitive status: Within functional limits for tasks assessed     MUSCLE LENGTH: Hamstrings: > 90 degrees bil   POSTURE: increased lumbar lordosis  PALPATION: Mild tenderness lumbar musculature, not tender over spinous processes  LUMBAR ROM:   AROM eval  Flexion 75 pt states she normally can put palms on the floor  Extension 30  Right lateral flexion 25  Left lateral flexion 25  Right rotation   Left rotation    (Blank rows = not tested)  TRUNK STRENGTH:  Decreased activation of transverse abdominus muscles; abdominals 4-/5; decreased activation of lumbar multifidi; trunk extensors 4-/5;   marked difficulty stabilizing with bird dogs and with excessive lumbar lordosis; difficulty stabilizing with left single leg  standing   LOWER EXTREMITY ROM:   grossly WFLS  LOWER EXTREMITY MMT:  able to rise from standard chair without UE use with ease  MMT Right eval Left eval 10/15  Hip flexion 5 5 5   Hip extension 5 4 L 4+  Hip abduction 4+ 4- R 4+/L 4  Hip adduction     Hip internal rotation 5 5 5   Hip external rotation 5 4 L 4+  Knee flexion     Knee extension     Ankle dorsiflexion     Ankle plantarflexion     Ankle inversion     Ankle eversion      (Blank rows = not tested)  LUMBAR SPECIAL TESTS:  Negative slump Negative SLR   GAIT:  Comments: WNLs  TREATMENT DATE:   08/02/24 NuStep: level 3x 6 minutes -PT present to discuss progress and status and plan for session Supine transverse abdominus draw in with marching 2x10 Supine isometric hand to opposite knee push 5 sec hold 10x Supine sequential bent knee lift and lower 10x cues for coordination of breathing Squats with 5# kettlebell with overhead press 2x10   Standing green band shoulder extension 2x10  Standing green band shoulder extension with opposite hip flexion 5x, and same side hip flexion Standing lat bar 30# 2x10 Leg press seat 6 75# 2x10 cues for knee alignment and to avoid hyperextension, single leg 50# 2x10 Standing Pallof series red band: press out, press up, holding arms at 90 degrees with marching and rounds   07/27/24 NuStep: level 3x 5 minutes -PT present to discuss progress and status and plan for session Supine transverse abdominus draw in 5 sec hold 5x cues to avoid pelvic tilt Supine isometric hand to opposite knee push 5 sec hold 5x Supine sequential bent knee lift and lower 10x cues for coordination of breathing Bird dogs: Ues only, LE only, alternating UE/LE 5x each  Squats with 5# kettlebell with overhead press 10x   Standing green band shoulder extension 15x  Standing green band shoulder extension with opposite hip flexion 5x, and same side hip flexion Standing lat bar 30# 10x  Leg press seat 6 70#  20x cues for knee alignment  Standing Pallof series red band: press out, press up, holding arms at 90 degrees with marching and then retro step 5x  2 rounds   07/19/24 NuStep: level 5x 5 minutes -PT present to discuss progress and status Supine transverse abdominus draw in 5 sec hold 5x Supine isometric hand to opposite knee push 5 sec hold 5x Supine sequential bent knee lift and lower 6x (challenging) Supine: green band with handles Ues hold at 90 degrees with marching 7x; holding Les 90/90 statically with single arm extensions 7x Bird dogs: Ues only, LE only, alternating UE/LE 5x each (Added to HEP- see below) Squats with 5# kettlebell 10x   Standing green band rows 15x (Added to HEP- see below) Standing green band shoulder extension 15x (Added to HEP- see below) Standing core strengthening series holding pair of 5 pound dumbbells while marching sets of 7 reps each:  1) Farmers hold; 2) single at the shoulder hold; 3) single overhead press hold      PATIENT EDUCATION:  Education details: Educated patient on anatomy and physiology of current symptoms, prognosis, plan of care as well as initial self care strategies to promote recovery Person educated: Patient Education method: Explanation Education comprehension: verbalized understanding  HOME EXERCISE PROGRAM: Access Code: [email or web address] URL: [email or web address] Date: 07/19/2024 Prepared by: Glade Pesa  Exercises - Standing Hamstring Stretch on Chair  - 1 x daily - 7 x weekly - 1 sets - 3 reps - 30 sec hold - Theatre manager with Chair and  Counter Support  - 1 x daily - 7 x weekly - 1 sets - 3 reps - 30 sec hold - Seated Hamstring Stretch  - 3 x daily - 7 x weekly - 1 sets - 3 reps - 20 hold - Seated Figure 4 Piriformis Stretch  - 3 x daily - 7 x weekly - 1 sets - 3 reps - 30 hold - Hooklying Transversus Abdominis Palpation  - 3 x daily - 7 x weekly - 1 sets - 10 reps - 5 hold - Seated Transversus Abdominis Bracing   - 1 x daily - 7 x weekly - 1 sets - 10 reps - 5 hold - Supine Hip Adduction Isometric with Ball  - 1-2 x daily - 7 x weekly - 3 sets - 10 reps - Clamshell  - 1-2 x daily - 7 x weekly - 2 sets - 10 reps - Resisted Sit-to-Stand With Dumbbell at Chest  - 2 x daily - 7 x weekly - 2 sets - 10 reps - Supine 90/90 Alternating Heel Touches with Posterior Pelvic Tilt  - 1 x daily - 7 x weekly - 1 sets - 20 reps - Supine Dead Bug with Leg Extension  - 1 x daily - 7 x weekly - 1 sets - 20 reps - Bird Dog  - 1 x daily - 7 x weekly - 1 sets - 10 reps - Standing Row with Anchored Resistance  - 1 x daily - 7 x weekly - 2 sets - 10 reps - Shoulder extension with resistance - Neutral  - 1 x daily - 7 x weekly - 2 sets - 10 reps  ASSESSMENT:  CLINICAL IMPRESSION: Pt arrived with no pain.  Pt reports that she has been sleeping better overall and is 70% improved with daily activities since the start of care.  She remains compliant with HEP for strength and flexibility.  Modified Oswestry is improved to 10% disability today.   PT monitored throughout session for alignment, technique, cueing and pain.  Lt LE was more challenged with single leg press today and pallof press with marching.  PT discussed pt joining a gym for continuation after D/C.  Patient will benefit from skilled PT to address the below impairments and improve overall function.    OBJECTIVE IMPAIRMENTS: decreased activity tolerance, decreased mobility, decreased strength, impaired perceived functional ability, and pain.   ACTIVITY LIMITATIONS: carrying, lifting, bending, sitting, sleeping, and locomotion level  PARTICIPATION LIMITATIONS: meal prep, cleaning, laundry, driving, shopping, community activity, and occupation  PERSONAL FACTORS: Time since onset of injury/illness/exacerbation are also affecting patient's functional outcome.   REHAB POTENTIAL: Good  CLINICAL DECISION MAKING: Stable/uncomplicated  EVALUATION COMPLEXITY:  Low   GOALS: Goals reviewed with patient? Yes  SHORT TERM GOALS: Target date: 07/27/2024    The patient will demonstrate knowledge of basic self care strategies and exercises to promote healing  Baseline: Goal status:met 10/15  2.  The patient will report a 30% improvement in pain levels with functional activities which are currently difficult including sitting, bending and sleeping Baseline: 70% improvement (08/02/24) Goal status: MET  3.  The patient will have improved left hip strength to 4/5 needed for single limb standing activities including curbs, steps, getting on/off the floor and kneeling  Baseline:  Goal status: met 10/15   LONG TERM GOALS: Target date: 08/24/2024   The patient will be independent in a safe self progression of a home exercise program to promote further recovery of function  Baseline:  Goal status: in progress   2.  The patient will report a 75% improvement in pain levels with functional activities which are currently difficult including sitting, bending and sleeping Baseline: 70% (08/02/24) Goal status: In progress   3.  The patient will have improved trunk flexor and extensor muscle strength to at least 4+/5 needed for lifting medium weight objects such as grocery bags, laundry and luggage  Baseline:  Goal status: INITIAL  4.  The patient will have improved left hip strength to at least 4+/5 needed for standing, walking longer distances and recreational activities like dancing Baseline:  Goal status: INITIAL  5.  Modified Oswestry functional outcome measure score improved to  24 % indicating improved function with ADLS with less pain.  Baseline: 5/50=10% (08/02/24) Goal status: MET  PLAN:  PT FREQUENCY: 2x/week  PT DURATION: 8 weeks  PLANNED INTERVENTIONS: 97164- PT Re-evaluation, 97110-Therapeutic exercises, 97530- Therapeutic activity, 97112- Neuromuscular re-education, 97535- Self Care, [id number]- Manual therapy, [phone]- Aquatic Therapy,  H9716- Electrical stimulation (unattended), [phone]- Electrical stimulation (manual), [id number]- Ultrasound, [id number]- Traction (mechanical), [id number]- Ionotophoresis 4mg /ml Dexamethasone, 79439 (1-2 muscles), 20561 (3+ muscles)- Dry Needling, Patient/Family education, Taping, Joint mobilization, Spinal manipulation, Spinal mobilization, Cryotherapy, and Moist heat.  PLAN FOR NEXT SESSION: Progress core strength, hip strength, quadruped and standing; left gluteal strengthening; hypermobility tendencies Discuss probable D/C soon  Burnard Joy, PT 08/02/24 9:29 AM    [facility] [address], [address] [place], [place] [address] Phone # [phone] Fax [phone]

## 2024-08-04 ENCOUNTER — Ambulatory Visit: Admitting: Physical Therapy

## 2024-08-09 ENCOUNTER — Ambulatory Visit

## 2024-08-09 ENCOUNTER — Telehealth: Payer: Self-pay

## 2024-08-09 NOTE — Telephone Encounter (Signed)
 PT called pt due to missed appt today.  She forgot her appt and can't reschedule for later due to work.  She confirmed appt on Thursday.

## 2024-08-10 NOTE — Therapy (Signed)
 OUTPATIENT PHYSICAL THERAPY TREATMENT   Patient Name: Kara Velez MRN: [id number] DOB:08-Jul-1991, 33 y.o., female Today's Date: 08/11/2024  END OF SESSION:  PT End of Session - 08/11/24 0851     Visit Number 7    Date for Recertification  08/24/24    Authorization Type Medicaid [employer] no auth until 27 visits    Authorization - Visit Number 7    Authorization - Number of Visits 27    Progress Note Due on Visit 10    PT Start Time 0849    PT Stop Time 0929    PT Time Calculation (min) 40 min    Activity Tolerance Patient tolerated treatment well    Behavior During Therapy WFL for tasks assessed/performed             Past Medical History:  Diagnosis Date   Migraine headache    Nexplanon in place    Past Surgical History:  Procedure Laterality Date   extraction of wisdom teeth     WISDOM TOOTH EXTRACTION     three removed   Patient Active Problem List   Diagnosis Date Noted   Migraine without aura and without status migrainosus, not intractable 12/12/2016   Chronic bilateral low back pain without sciatica 12/12/2016   Nexplanon in place 04/11/2016   Frequent headaches 04/11/2016    PCP: Vicenta Maduro FNP  REFERRING PROVIDER: Joshua Alm Hamilton MD  REFERRING DIAG: M54.16 chronic radicular pain of lower back  Rationale for Evaluation and Treatment: Rehabilitation  THERAPY DIAG:  Back pain; weakness ONSET DATE: 2 years with exacerbation in last 6 months  SUBJECTIVE:                                                                                                                                                                                           SUBJECTIVE STATEMENT: No pain today. Pain is intermittent.   Never had PT before; hypermobility tendencies  PERTINENT HISTORY:  Migraines  PAIN: 08/02/24  Are you having pain? Yes NPRS scale: 0/10 Pain location: lower back and left buttock Pain orientation: Bilateral  PAIN TYPE: aching and  burning Pain description: constant  Aggravating factors: driving for long; now sitting at a desk all day; bending; standing; lifting; sleeping Relieving factors: shifting positions   PRECAUTIONS: None    WEIGHT BEARING RESTRICTIONS: No  FALLS:  Has patient fallen in last 6 months? 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  DIAGNOSTIC FINDINGS:  X-rays done with signs of scoliosis but unsure; waiting for MRI to be scheduled  PATIENT SURVEYS:  Modified Oswestry:  MODIFIED OSWESTRY DISABILITY SCALE  Date: 9/17 Score  Pain intensity 4 =  Pain medication provides me with little relief from pain.  2. Personal care (washing, dressing, etc.) 1 =  I can take care of myself normally, but it increases my pain.  3. Lifting 1 = I can lift heavy weights, but it causes increased pain.  4. Walking 1 = Pain prevents me from walking more than 1 mile.  5. Sitting 1 =  I can only sit in my favorite chair as long as I like.  6. Standing 1 =  I can stand as long as I want but, it increases my pain.  7. Sleeping 3 =  Even when I take pain medication, I sleep less than 4 hours.  8. Social Life 2 = Pain prevents me from participating in more energetic activities (eg. sports, dancing).  9. Traveling 2 =  My pain restricts my travel over 2 hours.  10. Employment/ Homemaking 1 = My normal homemaking/job activities increase my pain, but I can still perform all that is required of me  Total 34%   Interpretation of scores: Score Category Description  0-20% Minimal Disability The patient can cope with most living activities. Usually no treatment is indicated apart from advice on lifting, sitting and exercise  21-40% Moderate Disability The  patient experiences more pain and difficulty with sitting, lifting and standing. Travel and social life are more difficult and they may be disabled from work. Personal care, sexual activity and sleeping are not grossly affected, and the patient can usually be managed by conservative means  41-60% Severe Disability Pain remains the main problem in this group, but activities of daily living are affected. These patients require a detailed investigation  61-80% Crippled Back pain impinges on all aspects of the patient's life. Positive intervention is required  81-100% Bed-bound  These patients are either bed-bound or exaggerating their symptoms  [name], et al. Surgery versus conservative management of stable thoracolumbar fracture: the PRESTO feasibility RCT. [place] ([place]): [employer]; 2021 Nov. [facility] Technology Assessment, No. 25.62.) Appendix 3, Oswestry Disability Index category descriptors. Available from: [email or web address].cz  Minimally Clinically Important Difference (MCID) = 12.8%  08/02/24: Modified Oswestry 5/10=10% disability   COGNITION: Overall cognitive status: Within functional limits for tasks assessed     MUSCLE LENGTH: Hamstrings: > 90 degrees bil   POSTURE: increased lumbar lordosis  PALPATION: Mild tenderness lumbar musculature, not tender over spinous processes  LUMBAR ROM:   AROM eval  Flexion 75 pt states she normally can put palms on the floor  Extension 30  Right lateral flexion 25  Left lateral flexion 25  Right rotation   Left rotation    (Blank rows = not tested)  TRUNK STRENGTH:  Decreased activation of transverse abdominus muscles; abdominals 4-/5; decreased activation of lumbar multifidi; trunk extensors 4-/5;   marked difficulty stabilizing with bird dogs and with excessive lumbar lordosis; difficulty stabilizing with left single leg standing   LOWER EXTREMITY ROM:   grossly WFLS  LOWER  EXTREMITY MMT:  able to rise from standard chair without UE use with ease  MMT Right eval Left eval 10/15  Hip flexion 5 5 5   Hip extension 5 4 L 4+  Hip abduction 4+ 4- R 4+/L 4  Hip adduction     Hip internal rotation 5 5 5   Hip external rotation 5 4 L 4+  Knee flexion     Knee extension     Ankle dorsiflexion     Ankle plantarflexion     Ankle inversion     Ankle eversion      (Blank rows = not tested)  LUMBAR SPECIAL TESTS:  Negative slump Negative SLR   GAIT:  Comments: WNLs  TREATMENT DATE:  08/11/24 NuStep: level 3x 6 minutes -PT present to discuss progress and status and plan for session Went over lifting techniques with box + 10# - multiple reps 15# KB squat lifts x 10 Dead lift education, then 5# wts x 10, then 15# KB x 10 Supine sequential bent knee lift and lower 10x cues for coordination of breathing Standing green band shoulder extension with opposite hip flexion 10x, and same side hip flexion 10x  B Standing lat bar 30# 2x10 Leg press seat 4 75# 2x10 cues for knee alignment and to avoid hyperextension, single leg 50# 2x10 Standing Pallof series red band: press out, press up, holding arms at 90 degrees with marching and rounds 10x ea B      08/02/24 NuStep: level 3x 6 minutes -PT present to discuss progress and status and plan for session Supine transverse abdominus draw in with marching 2x10 Supine isometric hand to opposite knee push 5 sec hold 10x Supine sequential bent knee lift and lower 10x cues for coordination of breathing Squats with 5# kettlebell with overhead press 2x10   Standing green band shoulder extension 2x10  Standing green band shoulder extension with opposite hip flexion 5x, and same side hip flexion Standing lat bar 30# 2x10 Leg press seat 6 75# 2x10 cues for knee alignment and to avoid hyperextension, single leg 50# 2x10 Standing Pallof series red band: press out, press up, holding arms at 90 degrees with marching and rounds    07/27/24 NuStep: level 3x 5 minutes -PT present to discuss progress and status and plan for session Supine transverse abdominus draw in 5 sec hold 5x cues to avoid pelvic tilt Supine isometric hand to opposite knee push 5 sec hold 5x Supine sequential bent knee lift and lower 10x cues for coordination of breathing Bird dogs: Ues only, LE only, alternating UE/LE 5x each  Squats with 5# kettlebell with overhead press 10x   Standing green band shoulder extension 15x  Standing green band shoulder extension with opposite hip flexion 5x, and same side hip flexion Standing lat bar 30# 10x  Leg press seat 6 70# 20x cues for knee alignment  Standing Pallof series red band: press out, press up, holding arms at 90 degrees with marching and then retro step 5x  2 rounds   07/19/24 NuStep: level 5x 5 minutes -PT present to discuss progress and status Supine transverse abdominus draw in 5 sec hold 5x Supine isometric hand to opposite knee push 5 sec hold 5x Supine sequential bent knee lift and lower 6x (challenging) Supine: green band with handles Ues hold at 90 degrees with marching 7x; holding Les 90/90 statically with single arm extensions 7x Bird dogs: Ues only, LE only, alternating UE/LE 5x each (Added to HEP- see below) Squats with 5# kettlebell 10x   Standing green band rows 15x (Added to HEP- see below) Standing green band shoulder extension 15x (Added to HEP- see below) Standing core strengthening series holding pair of 5 pound dumbbells  while marching sets of 7 reps each:  1) Farmers hold; 2) single at the shoulder hold; 3) single overhead press hold      PATIENT EDUCATION:  Education details: Educated patient on anatomy and physiology of current symptoms, prognosis, plan of care as well as initial self care strategies to promote recovery Person educated: Patient Education method: Explanation Education comprehension: verbalized understanding  HOME EXERCISE PROGRAM: Access Code:  [email or web address] URL: [email or web address] Date: 07/19/2024 Prepared by: Glade Pesa  Exercises - Standing Hamstring Stretch on Chair  - 1 x daily - 7 x weekly - 1 sets - 3 reps - 30 sec hold - Quadricep Stretch with Chair and Counter Support  - 1 x daily - 7 x weekly - 1 sets - 3 reps - 30 sec hold - Seated Hamstring Stretch  - 3 x daily - 7 x weekly - 1 sets - 3 reps - 20 hold - Seated Figure 4 Piriformis Stretch  - 3 x daily - 7 x weekly - 1 sets - 3 reps - 30 hold - Hooklying Transversus Abdominis Palpation  - 3 x daily - 7 x weekly - 1 sets - 10 reps - 5 hold - Seated Transversus Abdominis Bracing  - 1 x daily - 7 x weekly - 1 sets - 10 reps - 5 hold - Supine Hip Adduction Isometric with Ball  - 1-2 x daily - 7 x weekly - 3 sets - 10 reps - Clamshell  - 1-2 x daily - 7 x weekly - 2 sets - 10 reps - Resisted Sit-to-Stand With Dumbbell at Chest  - 2 x daily - 7 x weekly - 2 sets - 10 reps - Supine 90/90 Alternating Heel Touches with Posterior Pelvic Tilt  - 1 x daily - 7 x weekly - 1 sets - 20 reps - Supine Dead Bug with Leg Extension  - 1 x daily - 7 x weekly - 1 sets - 20 reps - Bird Dog  - 1 x daily - 7 x weekly - 1 sets - 10 reps - Standing Row with Anchored Resistance  - 1 x daily - 7 x weekly - 2 sets - 10 reps - Shoulder extension with resistance - Neutral  - 1 x daily - 7 x weekly - 2 sets - 10 reps  ASSESSMENT:  CLINICAL IMPRESSION:  Pt arrived with no pain.  Discussed discharge and ptt reports she still feels some strain with lifting items like laundry. We focused on lifting both with wide leg squat and dead lift forms. She demonstrates good form requiring minimal cues, but exercises were challenging. She demonstrates core weakness with Pallof exercises when standing on her left LE. She remains compliant with HEP for strength and flexibility. Patient challenged with leg press at seat 4 as well. She will likely be ready for d/c in next 1-2 visits.   OBJECTIVE  IMPAIRMENTS: decreased activity tolerance, decreased mobility, decreased strength, impaired perceived functional ability, and pain.   ACTIVITY LIMITATIONS: carrying, lifting, bending, sitting, sleeping, and locomotion level  PARTICIPATION LIMITATIONS: meal prep, cleaning, laundry, driving, shopping, community activity, and occupation  PERSONAL FACTORS: Time since onset of injury/illness/exacerbation are also affecting patient's functional outcome.   REHAB POTENTIAL: Good  CLINICAL DECISION MAKING: Stable/uncomplicated  EVALUATION COMPLEXITY: Low   GOALS: Goals reviewed with patient? Yes  SHORT TERM GOALS: Target date: 07/27/2024    The patient will demonstrate knowledge of basic self care strategies and exercises to promote healing  Baseline: Goal status:met 10/15  2.  The patient will report a 30% improvement in pain levels with functional activities which are currently difficult including sitting, bending and sleeping Baseline: 70% improvement (08/02/24) Goal status: MET  3.  The patient will have improved left hip strength to 4/5 needed for single limb standing activities including curbs, steps, getting on/off the floor and kneeling  Baseline:  Goal status: met 10/15   LONG TERM GOALS: Target date: 08/24/2024   The patient will be independent in a safe self progression of a home exercise program to promote further recovery of function  Baseline:  Goal status: in progress   2.  The patient will report a 75% improvement in pain levels with functional activities which are currently difficult including sitting, bending and sleeping Baseline: 70% (08/02/24) Goal status: In progress   3.  The patient will have improved trunk flexor and extensor muscle strength to at least 4+/5 needed for lifting medium weight objects such as grocery bags, laundry and luggage  Baseline:  Goal status: INITIAL  4.  The patient will have improved left hip strength to at least 4+/5 needed  for standing, walking longer distances and recreational activities like dancing Baseline:  Goal status: INITIAL  5.  Modified Oswestry functional outcome measure score improved to  24 % indicating improved function with ADLS with less pain.  Baseline: 5/50=10% (08/02/24) Goal status: MET  PLAN:  PT FREQUENCY: 2x/week  PT DURATION: 8 weeks  PLANNED INTERVENTIONS: 97164- PT Re-evaluation, 97110-Therapeutic exercises, 97530- Therapeutic activity, 97112- Neuromuscular re-education, 97535- Self Care, [id number]- Manual therapy, [phone]- Aquatic Therapy, H9716- Electrical stimulation (unattended), [phone]- Electrical stimulation (manual), [id number]- Ultrasound, [id number]- Traction (mechanical), [id number]- Ionotophoresis 4mg /ml Dexamethasone, 79439 (1-2 muscles), 20561 (3+ muscles)- Dry Needling, Patient/Family education, Taping, Joint mobilization, Spinal manipulation, Spinal mobilization, Cryotherapy, and Moist heat.  PLAN FOR NEXT SESSION: Progress core strength, hip strength, quadruped and standing; left gluteal strengthening; hypermobility tendencies Discuss probable D/C soon   Mliss Cummins, PT  08/11/24 9:29 AM    [facility] [address] [place], [place] [address] Phone # [phone] Fax [phone]

## 2024-08-11 ENCOUNTER — Ambulatory Visit: Admitting: Physical Therapy

## 2024-08-11 ENCOUNTER — Encounter: Payer: Self-pay | Admitting: Physical Therapy

## 2024-08-11 DIAGNOSIS — M6281 Muscle weakness (generalized): Secondary | ICD-10-CM

## 2024-08-11 DIAGNOSIS — R252 Cramp and spasm: Secondary | ICD-10-CM

## 2024-08-11 DIAGNOSIS — M5416 Radiculopathy, lumbar region: Secondary | ICD-10-CM | POA: Diagnosis not present

## 2024-08-11 DIAGNOSIS — R262 Difficulty in walking, not elsewhere classified: Secondary | ICD-10-CM

## 2024-08-11 DIAGNOSIS — R293 Abnormal posture: Secondary | ICD-10-CM

## 2024-08-16 ENCOUNTER — Ambulatory Visit: Admitting: Physical Therapy

## 2024-08-18 ENCOUNTER — Ambulatory Visit: Attending: Neurological Surgery

## 2024-08-18 DIAGNOSIS — R252 Cramp and spasm: Secondary | ICD-10-CM | POA: Diagnosis present

## 2024-08-18 DIAGNOSIS — R293 Abnormal posture: Secondary | ICD-10-CM | POA: Insufficient documentation

## 2024-08-18 DIAGNOSIS — R262 Difficulty in walking, not elsewhere classified: Secondary | ICD-10-CM | POA: Diagnosis present

## 2024-08-18 DIAGNOSIS — M5416 Radiculopathy, lumbar region: Secondary | ICD-10-CM | POA: Insufficient documentation

## 2024-08-18 DIAGNOSIS — M6281 Muscle weakness (generalized): Secondary | ICD-10-CM | POA: Insufficient documentation

## 2024-08-18 NOTE — Therapy (Signed)
 OUTPATIENT PHYSICAL THERAPY TREATMENT   Patient Name: Kara Velez MRN: [id number] DOB:11-10-1990, 33 y.o., female Today's Date: 08/18/2024  END OF SESSION:  PT End of Session - 08/18/24 0919     Visit Number 8    Authorization Type Medicaid [employer] no auth until 27 visits    Authorization - Visit Number 8    Authorization - Number of Visits 27    Progress Note Due on Visit 10    PT Start Time [id number]    PT Stop Time 0920   pt requested to leave by 9:20   PT Time Calculation (min) 29 min    Activity Tolerance Patient tolerated treatment well    Behavior During Therapy WFL for tasks assessed/performed              Past Medical History:  Diagnosis Date   Migraine headache    Nexplanon in place    Past Surgical History:  Procedure Laterality Date   extraction of wisdom teeth     WISDOM TOOTH EXTRACTION     three removed   Patient Active Problem List   Diagnosis Date Noted   Migraine without aura and without status migrainosus, not intractable 12/12/2016   Chronic bilateral low back pain without sciatica 12/12/2016   Nexplanon in place 04/11/2016   Frequent headaches 04/11/2016    PCP: Kara Maduro FNP  REFERRING PROVIDER: Joshua Alm Hamilton MD  REFERRING DIAG: M54.16 chronic radicular pain of lower back  Rationale for Evaluation and Treatment: Rehabilitation  THERAPY DIAG:  Back pain; weakness ONSET DATE: 2 years with exacerbation in last 6 months  SUBJECTIVE:                                                                                                                                                                                           SUBJECTIVE STATEMENT: 90-95% improvement since the start of care.  I've had a little bit of pain when sleeping but not consistent.   Never had PT before; hypermobility tendencies  PERTINENT HISTORY:  Migraines  PAIN: 08/18/24  Are you having pain? Yes NPRS scale: 0/10 Pain location: lower back and left  buttock Pain orientation: Bilateral  PAIN TYPE: aching and burning Pain description: constant  Aggravating factors: driving for long; now sitting at a desk all day; bending; standing; lifting; sleeping Relieving factors: shifting positions   PRECAUTIONS: None    WEIGHT BEARING RESTRICTIONS: No  FALLS:  Has patient fallen in last 6 months? 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  DIAGNOSTIC FINDINGS:  X-rays done with signs of scoliosis but unsure; waiting for MRI to be scheduled  PATIENT SURVEYS:  Modified Oswestry:  MODIFIED OSWESTRY DISABILITY SCALE  Date: 9/17 Score  Pain intensity 4 =  Pain medication provides me with little relief from pain.  2. Personal care (washing, dressing, etc.) 1 =  I can take care of myself normally, but it increases my pain.  3. Lifting 1 = I can lift heavy weights, but it causes increased pain.  4. Walking 1 = Pain prevents me from walking more than 1 mile.  5. Sitting 1 =  I can only sit in my favorite chair as long as I like.  6. Standing 1 =  I can stand as long as I want but, it increases my pain.  7. Sleeping 3 =  Even when I take pain medication, I sleep less than 4 hours.  8. Social Life 2 = Pain prevents me from participating in more energetic activities (eg. sports, dancing).  9. Traveling 2 =  My pain restricts my travel over 2 hours.  10. Employment/ Homemaking 1 = My normal homemaking/job activities increase my pain, but I can still perform all that is required of me  Total 34%   Interpretation of scores: Score Category Description  0-20% Minimal Disability The patient can cope with most living activities. Usually no treatment is indicated apart from advice on lifting,  sitting and exercise  21-40% Moderate Disability The patient experiences more pain and difficulty with sitting, lifting and standing. Travel and social life are more difficult and they may be disabled from work. Personal care, sexual activity and sleeping are not grossly affected, and the patient can usually be managed by conservative means  41-60% Severe Disability Pain remains the main problem in this group, but activities of daily living are affected. These patients require a detailed investigation  61-80% Crippled Back pain impinges on all aspects of the patient's life. Positive intervention is required  81-100% Bed-bound  These patients are either bed-bound or exaggerating their symptoms  [name], et al. Surgery versus conservative management of stable thoracolumbar fracture: the PRESTO feasibility RCT. [place] ([place]): [employer]; 2021 Nov. [facility] Technology Assessment, No. 25.62.) Appendix 3, Oswestry Disability Index category descriptors. Available from: [email or web address].cz  Minimally Clinically Important Difference (MCID) = 12.8%  08/02/24: Modified Oswestry 5/10=10% disability   COGNITION: Overall cognitive status: Within functional limits for tasks assessed     MUSCLE LENGTH: Hamstrings: > 90 degrees bil   POSTURE: increased lumbar lordosis  PALPATION: Mild tenderness lumbar musculature, not tender over spinous processes  LUMBAR ROM:   AROM eval  Flexion 75 pt states she normally can put palms on the floor  Extension 30  Right lateral flexion 25  Left lateral flexion 25  Right rotation   Left rotation    (Blank rows = not tested)  TRUNK STRENGTH:  Decreased activation of transverse abdominus muscles; abdominals 4-/5; decreased activation of lumbar multifidi; trunk extensors 4-/5;   marked difficulty stabilizing with bird dogs and with excessive lumbar lordosis; difficulty stabilizing with left single leg  standing   LOWER EXTREMITY ROM:   grossly WFLS  LOWER EXTREMITY MMT:  able to rise from standard chair without UE use with ease  MMT Right eval Left eval 10/15  Hip flexion 5 5 5   Hip extension 5 4 L 4+  Hip abduction 4+ 4- R 4+/L 4  Hip adduction     Hip internal rotation 5 5 5   Hip external rotation 5 4 L 4+  Knee flexion     Knee extension     Ankle dorsiflexion     Ankle plantarflexion     Ankle inversion     Ankle eversion      (Blank rows = not tested)  LUMBAR SPECIAL TESTS:  Negative slump Negative SLR   GAIT:  Comments: WNLs  TREATMENT DATE:   08/18/24 NuStep: level 3x 6 minutes -PT present to discuss progress and status and plan for session 15# KB squat lifts 2x10 Dead lift education,  15# KB x 10 Standing lat bar 30# 2x10 Leg press seat 4 75# 2x10 cues for knee alignment and to avoid hyperextension, single leg 50# 2x10 Seated hamstring stretch 2x30 seconds each   08/11/24 NuStep: level 3x 6 minutes -PT present to discuss progress and status and plan for session Went over lifting techniques with box + 10# - multiple reps 15# KB squat lifts x 10 Dead lift education, then 5# wts x 10, then 15# KB x 10 Supine sequential bent knee lift and lower 10x cues for coordination of breathing Standing green band shoulder extension with opposite hip flexion 10x, and same side hip flexion 10x  B Standing lat bar 30# 2x10 Leg press seat 4 75# 2x10 cues for knee alignment and to avoid hyperextension, single leg 50# 2x10 Standing Pallof series red band: press out, press up, holding arms at 90 degrees with marching and rounds 10x ea B      08/02/24 NuStep: level 3x 6 minutes -PT present to discuss progress and status and plan for session Supine transverse abdominus draw in with marching 2x10 Supine isometric hand to opposite knee push 5 sec hold 10x Supine sequential bent knee lift and lower 10x cues for coordination of breathing Squats with 5# kettlebell with  overhead press 2x10   Standing green band shoulder extension 2x10  Standing green band shoulder extension with opposite hip flexion 5x, and same side hip flexion Standing lat bar 30# 2x10 Leg press seat 6 75# 2x10 cues for knee alignment and to avoid hyperextension, single leg 50# 2x10 Standing Pallof series red band: press out, press up, holding arms at 90 degrees with marching and rounds      PATIENT EDUCATION:  Education details: Educated patient on anatomy and physiology of current symptoms, prognosis, plan of care as well as initial self care strategies to promote recovery Person educated: Patient Education method: Explanation Education comprehension: verbalized understanding  HOME EXERCISE PROGRAM: Access Code: [email or web address] URL: [email or web address] Date: 07/19/2024 Prepared by: Glade Pesa  Exercises - Standing Hamstring Stretch on Chair  - 1 x daily - 7 x weekly - 1 sets - 3 reps - 30 sec hold - Quadricep Stretch with Chair and Counter Support  - 1 x daily - 7 x weekly - 1 sets - 3 reps - 30 sec hold - Seated Hamstring Stretch  - 3 x daily - 7 x weekly - 1 sets - 3 reps - 20 hold - Seated Figure 4 Piriformis Stretch  - 3 x daily - 7 x weekly - 1 sets - 3 reps - 30 hold - Hooklying Transversus Abdominis Palpation  - 3 x daily - 7 x weekly - 1 sets - 10 reps - 5 hold - Seated Transversus Abdominis  Bracing  - 1 x daily - 7 x weekly - 1 sets - 10 reps - 5 hold - Supine Hip Adduction Isometric with Ball  - 1-2 x daily - 7 x weekly - 3 sets - 10 reps - Clamshell  - 1-2 x daily - 7 x weekly - 2 sets - 10 reps - Resisted Sit-to-Stand With Dumbbell at Chest  - 2 x daily - 7 x weekly - 2 sets - 10 reps - Supine 90/90 Alternating Heel Touches with Posterior Pelvic Tilt  - 1 x daily - 7 x weekly - 1 sets - 20 reps - Supine Dead Bug with Leg Extension  - 1 x daily - 7 x weekly - 1 sets - 20 reps - Bird Dog  - 1 x daily - 7 x weekly - 1 sets - 10 reps - Standing Row with  Anchored Resistance  - 1 x daily - 7 x weekly - 2 sets - 10 reps - Shoulder extension with resistance - Neutral  - 1 x daily - 7 x weekly - 2 sets - 10 reps  ASSESSMENT:  CLINICAL IMPRESSION: Pt is ready to D/C to HEP.  She reports 90-95% overall improvement in symptoms since the start of care.  Pt denies any functional deficits.  Pt is independent and compliant with HEP and plans to join the gym to progress strength. Shortened session as pt needed to leave by 9:20.  PT monitored throughout session for technique.    OBJECTIVE IMPAIRMENTS: decreased activity tolerance, decreased mobility, decreased strength, impaired perceived functional ability, and pain.   ACTIVITY LIMITATIONS: carrying, lifting, bending, sitting, sleeping, and locomotion level  PARTICIPATION LIMITATIONS: meal prep, cleaning, laundry, driving, shopping, community activity, and occupation  PERSONAL FACTORS: Time since onset of injury/illness/exacerbation are also affecting patient's functional outcome.   REHAB POTENTIAL: Good  CLINICAL DECISION MAKING: Stable/uncomplicated  EVALUATION COMPLEXITY: Low   GOALS: Goals reviewed with patient? Yes  SHORT TERM GOALS: Target date: 07/27/2024    The patient will demonstrate knowledge of basic self care strategies and exercises to promote healing  Baseline: Goal status:met 10/15  2.  The patient will report a 30% improvement in pain levels with functional activities which are currently difficult including sitting, bending and sleeping Baseline: 70% improvement (08/02/24) Goal status: MET  3.  The patient will have improved left hip strength to 4/5 needed for single limb standing activities including curbs, steps, getting on/off the floor and kneeling  Baseline:  Goal status: met 10/15   LONG TERM GOALS: Target date: 08/24/2024   The patient will be independent in a safe self progression of a home exercise program to promote further recovery of function  Baseline:   Goal status: MET  2.  The patient will report a 75% improvement in pain levels with functional activities which are currently difficult including sitting, bending and sleeping Baseline: 90-95% 08/18/24 Goal status: MET   3.  The patient will have improved trunk flexor and extensor muscle strength to at least 4+/5 needed for lifting medium weight objects such as grocery bags, laundry and luggage  Baseline: no limitations (08/18/24) Goal status: MET  4.  The patient will have improved left hip strength to at least 4+/5 needed for standing, walking longer distances and recreational activities like dancing Baseline: no limits (08/18/24) Goal status: MET  5.  Modified Oswestry functional outcome measure score improved to  24 % indicating improved function with ADLS with less pain.  Baseline: 5/50=10% (08/02/24) Goal status: MET  PLAN: PHYSICAL THERAPY DISCHARGE SUMMARY  Visits from Start of Care: 8  Current functional level related to goals / functional outcomes: 90-95% overall improvement since the start of care.  No functional deficits remain.    Remaining deficits: Pt denies any functional deficits at this time.  She plans to begin exercising at the gym.    Education / Equipment: HEP, gym exercises, body mechanics    Patient agrees to discharge. Patient goals were met. Patient is being discharged due to meeting the stated rehab goals.   Burnard Joy, PT 08/18/24 9:23 AM    [facility] [address], [address] [place], [place] [address] Phone # [phone] Fax [phone]
